# Patient Record
Sex: Male | Born: 1935 | Race: White | Hispanic: No | Smoking: Never smoker
Health system: Southern US, Community
[De-identification: ages and names within clinical notes are randomized; demographics above are authoritative.]

## PROBLEM LIST (undated history)

## (undated) DIAGNOSIS — N189 Chronic kidney disease, unspecified: Secondary | ICD-10-CM

## (undated) DIAGNOSIS — F0390 Unspecified dementia without behavioral disturbance: Secondary | ICD-10-CM

## (undated) HISTORY — PX: TOTAL HIP ARTHROPLASTY: SHX124

## (undated) HISTORY — PX: CHOLECYSTECTOMY: SHX55

---

## 2014-07-09 DIAGNOSIS — N400 Enlarged prostate without lower urinary tract symptoms: Secondary | ICD-10-CM | POA: Insufficient documentation

## 2015-11-23 DIAGNOSIS — D631 Anemia in chronic kidney disease: Secondary | ICD-10-CM | POA: Diagnosis present

## 2015-11-23 DIAGNOSIS — N183 Chronic kidney disease, stage 3 unspecified: Secondary | ICD-10-CM | POA: Diagnosis present

## 2018-04-06 DIAGNOSIS — F419 Anxiety disorder, unspecified: Secondary | ICD-10-CM | POA: Insufficient documentation

## 2018-04-06 DIAGNOSIS — G609 Hereditary and idiopathic neuropathy, unspecified: Secondary | ICD-10-CM | POA: Insufficient documentation

## 2021-08-10 ENCOUNTER — Other Ambulatory Visit: Payer: Self-pay

## 2021-08-10 ENCOUNTER — Emergency Department (HOSPITAL_BASED_OUTPATIENT_CLINIC_OR_DEPARTMENT_OTHER)
Admission: EM | Admit: 2021-08-10 | Discharge: 2021-08-10 | Disposition: A | Discharge status: Home / Self Care | Payer: Medicare Other | Attending: Emergency Medicine | Admitting: Emergency Medicine

## 2021-08-10 ENCOUNTER — Emergency Department (HOSPITAL_BASED_OUTPATIENT_CLINIC_OR_DEPARTMENT_OTHER): Payer: Medicare Other

## 2021-08-10 ENCOUNTER — Encounter (HOSPITAL_BASED_OUTPATIENT_CLINIC_OR_DEPARTMENT_OTHER): Payer: Self-pay | Admitting: *Deleted

## 2021-08-10 DIAGNOSIS — Z96642 Presence of left artificial hip joint: Secondary | ICD-10-CM | POA: Insufficient documentation

## 2021-08-10 DIAGNOSIS — F0390 Unspecified dementia without behavioral disturbance: Secondary | ICD-10-CM | POA: Insufficient documentation

## 2021-08-10 DIAGNOSIS — F0391 Unspecified dementia with behavioral disturbance: Secondary | ICD-10-CM

## 2021-08-10 HISTORY — DX: Unspecified dementia, unspecified severity, without behavioral disturbance, psychotic disturbance, mood disturbance, and anxiety: F03.90

## 2021-08-10 LAB — CBC WITH DIFFERENTIAL/PLATELET
Abs Immature Granulocytes: 0.02 10*3/uL (ref 0.00–0.07)
Basophils Absolute: 0.1 10*3/uL (ref 0.0–0.1)
Basophils Relative: 1 %
Eosinophils Absolute: 0.2 10*3/uL (ref 0.0–0.5)
Eosinophils Relative: 3 %
HCT: 34.9 % — ABNORMAL LOW (ref 39.0–52.0)
Hemoglobin: 11.6 g/dL — ABNORMAL LOW (ref 13.0–17.0)
Immature Granulocytes: 0 %
Lymphocytes Relative: 31 %
Lymphs Abs: 2.7 10*3/uL (ref 0.7–4.0)
MCH: 32.6 pg (ref 26.0–34.0)
MCHC: 33.2 g/dL (ref 30.0–36.0)
MCV: 98 fL (ref 80.0–100.0)
Monocytes Absolute: 0.6 10*3/uL (ref 0.1–1.0)
Monocytes Relative: 7 %
Neutro Abs: 5.1 10*3/uL (ref 1.7–7.7)
Neutrophils Relative %: 58 %
Platelets: 197 10*3/uL (ref 150–400)
RBC: 3.56 MIL/uL — ABNORMAL LOW (ref 4.22–5.81)
RDW: 13.1 % (ref 11.5–15.5)
WBC: 8.7 10*3/uL (ref 4.0–10.5)
nRBC: 0 % (ref 0.0–0.2)

## 2021-08-10 LAB — COMPREHENSIVE METABOLIC PANEL
ALT: 12 U/L (ref 0–44)
AST: 21 U/L (ref 15–41)
Albumin: 4 g/dL (ref 3.5–5.0)
Alkaline Phosphatase: 86 U/L (ref 38–126)
Anion gap: 8 (ref 5–15)
BUN: 29 mg/dL — ABNORMAL HIGH (ref 8–23)
CO2: 24 mmol/L (ref 22–32)
Calcium: 8.6 mg/dL — ABNORMAL LOW (ref 8.9–10.3)
Chloride: 107 mmol/L (ref 98–111)
Creatinine, Ser: 1.71 mg/dL — ABNORMAL HIGH (ref 0.61–1.24)
GFR, Estimated: 39 mL/min — ABNORMAL LOW (ref >60)
Glucose, Bld: 85 mg/dL (ref 70–99)
Potassium: 4.4 mmol/L (ref 3.5–5.1)
Sodium: 139 mmol/L (ref 135–145)
Total Bilirubin: 0.4 mg/dL (ref 0.3–1.2)
Total Protein: 7.4 g/dL (ref 6.5–8.1)

## 2021-08-10 LAB — URINALYSIS, ROUTINE W REFLEX MICROSCOPIC
Bilirubin Urine: NEGATIVE
Glucose, UA: NEGATIVE mg/dL
Hgb urine dipstick: NEGATIVE
Ketones, ur: NEGATIVE mg/dL
Leukocytes,Ua: NEGATIVE
Nitrite: NEGATIVE
Protein, ur: NEGATIVE mg/dL
Specific Gravity, Urine: 1.015 (ref 1.005–1.030)
pH: 6 (ref 5.0–8.0)

## 2021-08-10 MED ORDER — ZOLPIDEM TARTRATE 5 MG PO TABS: 5.0000 mg | ORAL_TABLET | Freq: Every evening | ORAL | 0 refills | Status: DC | PRN

## 2021-08-10 MED ORDER — SODIUM CHLORIDE 0.9 % IV BOLUS
1000.0000 mL | Freq: Once | INTRAVENOUS | Status: AC
  Administered 2021-08-10: 1000 mL via INTRAVENOUS

## 2021-08-10 MED ORDER — HYDROXYZINE HCL 10 MG PO TABS: 10.0000 mg | ORAL_TABLET | Freq: Three times a day (TID) | ORAL | 0 refills | Status: DC | PRN

## 2021-08-10 NOTE — Discharge Instructions (Signed)
He likely has worsening dementia.  Please continue his mirtazapine.  If he has trouble sleeping, he can try Ambien at night.  You can try hydroxyzine for anxiety during the day  See your doctor for follow-up.  Please continue to work on placement to a memory care unit  Return to ER if he has worse agitation, aggressive behavior, anxiety

## 2021-08-10 NOTE — ED Provider Notes (Signed)
Presented [facility] EMERGENCY DEPARTMENT Provider Note   CSN: [id number] Arrival date & time: 08/10/21  1623     History Chief Complaint  Patient presents with   Dementia    Jesse Cherry is a 87 y.o. male hx of dementia, with worsening mental status and dementia.  Patient has worsening dementia over the last month or so.  Per the daughter who is at bedside, patient lives at home and gets very anxious.  He calls her constantly at work.  He also wanders around the neighborhood.  Also he does not sleep well at night and just walks around.  Patient was started on mirtazapine with minimal improvement.  Daughter already started the process for memory care unit and just submitted paperwork for [facility].  Denies any recent falls .  Patient unable to give me much meaningful history and history.  Daughter at bedside.  The history is provided by the patient and a caregiver.      Past Medical History:  Diagnosis Date   Dementia (HCC)     There are no problems to display for this patient.   Past Surgical History:  Procedure Laterality Date   CHOLECYSTECTOMY     TOTAL HIP ARTHROPLASTY Left        History reviewed. No pertinent family history.  Social History   Tobacco Use   Smoking status: Never   Smokeless tobacco: Never  Substance Use Topics   Alcohol use: Never   Drug use: Never    Home Medications Prior to Admission medications   Not on File    Allergies    Demerol [meperidine hcl]  Review of Systems   Review of Systems  Psychiatric/Behavioral:  Positive for confusion.   All other systems reviewed and are negative.  Physical Exam Updated Vital Signs BP 100/87   Pulse 65   Temp 98.4 F (36.9 C) (Oral)   Resp 16   Ht 5\' 10"  (1.778 m)   Wt 68 kg   SpO2 99%   BMI 21.52 kg/m   Physical Exam Vitals and nursing note reviewed.  Constitutional:      Comments: Demented  HENT:     Head: Normocephalic.     Nose: Nose normal.     Mouth/Throat:      Mouth: Mucous membranes are moist.  Eyes:     Extraocular Movements: Extraocular movements intact.     Pupils: Pupils are equal, round, and reactive to light.  Cardiovascular:     Rate and Rhythm: Normal rate and regular rhythm.     Pulses: Normal pulses.     Heart sounds: Normal heart sounds.  Pulmonary:     Effort: Pulmonary effort is normal.     Breath sounds: Normal breath sounds.  Abdominal:     General: Abdomen is flat.     Palpations: Abdomen is soft.  Musculoskeletal:        General: Normal range of motion.     Cervical back: Normal range of motion and neck supple.  Skin:    General: Skin is warm.     Capillary Refill: Capillary refill takes less than 2 seconds.  Neurological:     Comments: Demented and ANO x2.  Patient is able to ambulate and has nonfocal neuro exam.  Psychiatric:        Mood and Affect: Mood normal.    ED Results / Procedures / Treatments   Labs (all labs ordered are listed, but only abnormal results are displayed) Labs Reviewed  CBC  WITH DIFFERENTIAL/PLATELET - Abnormal; Notable for the following components:      Result Value   RBC 3.56 (*)    Hemoglobin 11.6 (*)    HCT 34.9 (*)    All other components within normal limits  COMPREHENSIVE METABOLIC PANEL - Abnormal; Notable for the following components:   BUN 29 (*)    Creatinine, Ser 1.71 (*)    Calcium 8.6 (*)    GFR, Estimated 39 (*)    All other components within normal limits  URINALYSIS, ROUTINE W REFLEX MICROSCOPIC    EKG None  Radiology CT HEAD WO CONTRAST ( )  Result Date: 08/10/2021 CLINICAL DATA:  Dementia patient with altered mental status. EXAM: CT HEAD WITHOUT CONTRAST TECHNIQUE: Contiguous axial images were obtained from the base of the skull through the vertex without intravenous contrast. COMPARISON:  Brain MRI 01/09/2016 FINDINGS: Brain: No evidence of acute infarction, hemorrhage, hydrocephalus, extra-axial collection or mass lesion/mass effect. Generalized  atrophy and ventriculomegaly, with slight progression from 2017 exam. Remote right basal gangliar lacunar infarct. Vascular: Atherosclerosis of skullbase vasculature without hyperdense vessel or abnormal calcification. Skull: No fracture or focal lesion. Sinuses/Orbits: Paranasal sinuses and mastoid air cells are clear. The visualized orbits are unremarkable. Bilateral cataract resection. Other: None. IMPRESSION: 1. No acute intracranial abnormality. 2. Generalized atrophy and ventriculomegaly, with slight progression from 2017 exam. Electronically Signed   By: Narda Rutherford M.D.   On: 08/10/2021 19:58   DG Chest Port 1 View  Result Date: 08/10/2021 CLINICAL DATA:  Weakness. EXAM: PORTABLE CHEST 1 VIEW COMPARISON:  10/31/2018 FINDINGS: Stable heart size.The cardiomediastinal contours are unchanged with aortic tortuosity. Streaky right basilar atelectasis or scarring. Pulmonary vasculature is normal. No consolidation, pleural effusion, or pneumothorax. No acute osseous abnormalities are seen. IMPRESSION: Streaky right basilar atelectasis or scarring. Electronically Signed   By: Narda Rutherford M.D.   On: 08/10/2021 20:04    Procedures Procedures   Medications Ordered in ED Medications  sodium chloride 0.9 % bolus 1,000 mL (0 mLs Intravenous Stopped 08/10/21 2034)    ED Course  I have reviewed the triage vital signs and the nursing notes.  Pertinent labs & imaging results that were available during my care of the patient were reviewed by me and considered in my medical decision making (see chart for details).    MDM Rules/Calculators/A&P                           Jesse Cherry is a 87 y.o. male here presenting with worsening dementia.  Patient also has some behavioral disturbances.  I told daughter that this is likely progression of his dementia.  We will get CT head and labs and urinalysis and chest x-ray to rule out organic causes.  Patient is already in the process of getting into a  memory care unit  8:53 PM CT head unremarkable.  Labs showed creatinine of 1.7 and baseline is 1.6.  Urinalysis unremarkable and chest x-ray unremarkable.  Daughter is just very concerned about sleep and anxiety during the day.  I told her that we can try Ambien as needed at night and hydroxyzine during the day.  I told her to continue to work on placement.  I offered home health but she states that he does not like any strangers in the home and has changed the way home health aides previously.  Final Clinical Impression(s) / ED Diagnoses Final diagnoses:  None    Rx / DC Orders ED  Discharge Orders     None        Charlynne Pander, MD 08/10/21 2055

## 2021-08-10 NOTE — ED Triage Notes (Signed)
Pts daughter reports pt has a hx of dementia, states that in the last month, the forgetfulness has been worse.  Denies injury.  Requesting to have pt placed in nursing home.  Pt ambulatory. A/O per his norm.

## 2021-12-23 ENCOUNTER — Encounter (HOSPITAL_BASED_OUTPATIENT_CLINIC_OR_DEPARTMENT_OTHER): Payer: Self-pay | Admitting: *Deleted

## 2021-12-23 ENCOUNTER — Emergency Department (HOSPITAL_BASED_OUTPATIENT_CLINIC_OR_DEPARTMENT_OTHER)
Admission: EM | Admit: 2021-12-23 | Discharge: 2021-12-23 | Disposition: A | Discharge status: Home / Self Care | Payer: Medicare Other | Attending: Emergency Medicine | Admitting: Emergency Medicine

## 2021-12-23 ENCOUNTER — Emergency Department (HOSPITAL_BASED_OUTPATIENT_CLINIC_OR_DEPARTMENT_OTHER): Payer: Medicare Other

## 2021-12-23 ENCOUNTER — Other Ambulatory Visit: Payer: Self-pay

## 2021-12-23 DIAGNOSIS — U071 COVID-19: Secondary | ICD-10-CM | POA: Diagnosis not present

## 2021-12-23 DIAGNOSIS — R4182 Altered mental status, unspecified: Secondary | ICD-10-CM

## 2021-12-23 DIAGNOSIS — F03C3 Unspecified dementia, severe, with mood disturbance: Secondary | ICD-10-CM | POA: Diagnosis not present

## 2021-12-23 DIAGNOSIS — E860 Dehydration: Secondary | ICD-10-CM | POA: Insufficient documentation

## 2021-12-23 LAB — URINALYSIS, MICROSCOPIC (REFLEX)

## 2021-12-23 LAB — CBC WITH DIFFERENTIAL/PLATELET
Abs Immature Granulocytes: 0.1 10*3/uL — ABNORMAL HIGH (ref 0.00–0.07)
Basophils Absolute: 0 10*3/uL (ref 0.0–0.1)
Basophils Relative: 0 %
Eosinophils Absolute: 0.1 10*3/uL (ref 0.0–0.5)
Eosinophils Relative: 1 %
HCT: 36 % — ABNORMAL LOW (ref 39.0–52.0)
Hemoglobin: 11.8 g/dL — ABNORMAL LOW (ref 13.0–17.0)
Immature Granulocytes: 1 %
Lymphocytes Relative: 18 %
Lymphs Abs: 1.9 10*3/uL (ref 0.7–4.0)
MCH: 30.6 pg (ref 26.0–34.0)
MCHC: 32.8 g/dL (ref 30.0–36.0)
MCV: 93.3 fL (ref 80.0–100.0)
Monocytes Absolute: 0.8 10*3/uL (ref 0.1–1.0)
Monocytes Relative: 8 %
Neutro Abs: 7.5 10*3/uL (ref 1.7–7.7)
Neutrophils Relative %: 72 %
Platelets: 367 10*3/uL (ref 150–400)
RBC: 3.86 MIL/uL — ABNORMAL LOW (ref 4.22–5.81)
RDW: 13 % (ref 11.5–15.5)
WBC: 10.4 10*3/uL (ref 4.0–10.5)
nRBC: 0 % (ref 0.0–0.2)

## 2021-12-23 LAB — COMPREHENSIVE METABOLIC PANEL
ALT: 7 U/L (ref 0–44)
AST: 12 U/L — ABNORMAL LOW (ref 15–41)
Albumin: 3.6 g/dL (ref 3.5–5.0)
Alkaline Phosphatase: 72 U/L (ref 38–126)
Anion gap: 11 (ref 5–15)
BUN: 45 mg/dL — ABNORMAL HIGH (ref 8–23)
CO2: 24 mmol/L (ref 22–32)
Calcium: 9.1 mg/dL (ref 8.9–10.3)
Chloride: 104 mmol/L (ref 98–111)
Creatinine, Ser: 1.75 mg/dL — ABNORMAL HIGH (ref 0.61–1.24)
GFR, Estimated: 37 mL/min — ABNORMAL LOW (ref >60)
Glucose, Bld: 103 mg/dL — ABNORMAL HIGH (ref 70–99)
Potassium: 3.6 mmol/L (ref 3.5–5.1)
Sodium: 139 mmol/L (ref 135–145)
Total Bilirubin: 0.6 mg/dL (ref 0.3–1.2)
Total Protein: 7.6 g/dL (ref 6.5–8.1)

## 2021-12-23 LAB — URINALYSIS, ROUTINE W REFLEX MICROSCOPIC
Bilirubin Urine: NEGATIVE
Glucose, UA: NEGATIVE mg/dL
Hgb urine dipstick: NEGATIVE
Ketones, ur: NEGATIVE mg/dL
Nitrite: NEGATIVE
Protein, ur: 30 mg/dL — AB
Specific Gravity, Urine: 1.025 (ref 1.005–1.030)
pH: 5.5 (ref 5.0–8.0)

## 2021-12-23 LAB — RESP PANEL BY RT-PCR (FLU A&B, COVID) ARPGX2
Influenza A by PCR: NEGATIVE
Influenza B by PCR: NEGATIVE
SARS Coronavirus 2 by RT PCR: POSITIVE — AB

## 2021-12-23 MED ORDER — SODIUM CHLORIDE 0.9 % IV BOLUS
500.0000 mL | Freq: Once | INTRAVENOUS | Status: AC
  Administered 2021-12-23: 500 mL via INTRAVENOUS

## 2021-12-23 MED ORDER — SODIUM CHLORIDE 0.9 % IV SOLN: INTRAVENOUS | Status: DC

## 2021-12-23 NOTE — ED Notes (Signed)
Closing eyes intermittently, all safety matters remain implemented for pt safety. When attempting to ask questions, pt does not respond appropriately. Intermittently with call out "Help Me". Comfort measures provided.

## 2021-12-23 NOTE — Discharge Instructions (Signed)
Work-up without any acute findings.  COVID influenza testing still pending.  Urinalysis not classic for urinary tract infection.  Sent for culture and if it grows any significant bacteria of the facility will be updated.  But no clear evidence of urinary tract infection.  Work-up also otherwise negative other than some mild dehydration.  Patient stable for [facility].

## 2021-12-23 NOTE — ED Provider Notes (Addendum)
[facility] Provider Note   CSN: [id number] Arrival date & time: 12/23/21  1043     History  Chief Complaint  Patient presents with   Altered Mental Status    Jesse Cherry is a 88 y.o. male.  Patient brought in by family members.  Patient was being transferred from [facility].  Family feels that his mental status is worse than usual.  They seems to be more sedated.  They are concerned has not been eating or drinking.  Patient was diagnosed with COVID at the facility on January 24 according to the daughter.  No records in our system about that.  Patient known to have dementia known to be combative at times.  Patient's had his gallbladder removed.  Patient's medications that are listed are hydroxyzine and Ambien.  But suspect he is on other things.  But we do not have records.   Past medical otherwise significant for dementia gallbladder removal patient never smoked.      Home Medications Prior to Admission medications   Medication Sig Start Date End Date Taking? Authorizing Provider  hydrOXYzine (ATARAX/VISTARIL) 10 MG tablet Take 1 tablet (10 mg total) by mouth every 8 (eight) hours as needed. 08/10/21   Drenda Freeze, MD  zolpidem (AMBIEN) 5 MG tablet Take 1 tablet (5 mg total) by mouth at bedtime as needed for sleep. 08/10/21   Drenda Freeze, MD      Allergies    Demerol [meperidine hcl]    Review of Systems   Review of Systems  Unable to perform ROS: Dementia   Physical Exam Updated Vital Signs BP 113/65    Pulse (!) 57    Temp 98.4 F (36.9 C) (Oral)    Resp 17    Ht 1.778 m (5\' 10" )    Wt 68 kg    SpO2 97%    BMI 21.51 kg/m  Physical Exam Vitals and nursing note reviewed.  Constitutional:      General: He is not in acute distress.    Appearance: Normal appearance. He is well-developed. He is ill-appearing. He is not toxic-appearing.  HENT:     Head: Normocephalic and atraumatic.      Mouth/Throat:     Mouth: Mucous membranes are dry.  Eyes:     Extraocular Movements: Extraocular movements intact.     Conjunctiva/sclera: Conjunctivae normal.     Pupils: Pupils are equal, round, and reactive to light.  Cardiovascular:     Rate and Rhythm: Normal rate and regular rhythm.     Heart sounds: No murmur heard. Pulmonary:     Effort: Pulmonary effort is normal. No respiratory distress.     Breath sounds: Normal breath sounds.  Abdominal:     General: There is no distension.     Palpations: Abdomen is soft.     Tenderness: There is no abdominal tenderness. There is no guarding.  Musculoskeletal:        General: No swelling.     Cervical back: Normal range of motion and neck supple.     Right lower leg: No edema.     Left lower leg: No edema.  Skin:    General: Skin is warm and dry.     Capillary Refill: Capillary refill takes less than 2 seconds.  Neurological:     Mental Status: He is alert. Mental status is at baseline.  Psychiatric:        Mood and Affect: Mood  normal.    ED Results / Procedures / Treatments   Labs (all labs ordered are listed, but only abnormal results are displayed) Labs Reviewed  CBC WITH DIFFERENTIAL/PLATELET - Abnormal; Notable for the following components:      Result Value   RBC 3.86 (*)    Hemoglobin 11.8 (*)    HCT 36.0 (*)    Abs Immature Granulocytes 0.10 (*)    All other components within normal limits  COMPREHENSIVE METABOLIC PANEL - Abnormal; Notable for the following components:   Glucose, Bld 103 (*)    BUN 45 (*)    Creatinine, Ser 1.75 (*)    AST 12 (*)    GFR, Estimated 37 (*)    All other components within normal limits  RESP PANEL BY RT-PCR (FLU A&B, COVID) [id number]  URINALYSIS, ROUTINE W REFLEX MICROSCOPIC    EKG EKG Interpretation  Date/Time:  Monday December 23 2021 13:10:05 EST Ventricular Rate:  64 PR Interval:  154 QRS Duration: 98 QT Interval:  432 QTC Calculation: 446 R Axis:   -44 Text  Interpretation: Sinus rhythm Left anterior fascicular block Low voltage, precordial leads No previous ECGs available Confirmed by Fredia Sorrow [phone]) on 12/23/2021 2:28:31 PM  Radiology DG Chest Port 1 View  Result Date: 12/23/2021 CLINICAL DATA:  Altered mental status EXAM: PORTABLE CHEST 1 VIEW COMPARISON:  Chest x-ray dated August 10, 2021 FINDINGS: Cardiac and mediastinal contours are unchanged when accounting for differences in lung volumes. Mild bibasilar atelectasis. No focal consolidation. No large pleural effusion or pneumothorax. IMPRESSION: No active disease. Electronically Signed   By: Yetta Glassman M.D.   On: 12/23/2021 12:50    Procedures Procedures    Medications Ordered in ED Medications  0.9 %  sodium chloride infusion (has no administration in time range)  sodium chloride 0.9 % bolus 500 mL (500 mLs Intravenous New Bag/Given 12/23/21 1334)    ED Course/ Medical Decision Making/ A&P                           Medical Decision Making Amount and/or Complexity of Data Reviewed Labs: ordered. Radiology: ordered.  Risk Prescription drug management.   Patient's family wants a work-up for change in mental status.  Not able to directly compare.  But patient certainly has a history of significant dementia and known to be combative.  She possibly could have been sedated at [place] for the transfer to [facility] today.  Patient will stick his tongue out.  And it is dry.  Still clinically appears dry.  No leg swelling.  Abdomen soft nontender.  No history of any nausea vomiting or diarrhea according to family.  CBC significant for hemoglobin 11.8.  White blood cell count is normal at 10.4.  The complete metabolic panel glucose is 103 CO2 24 potassium good at 3.6 sodium good at 139.  BUN elevated at 45 creatinine 1.37 for a GFR of 37 but patient has been close to that in the past.  So no significant change.  But clinically does appear dehydrated we will give some  IV fluids.  Still a family concern will get CT head chest x-ray we will formalize the COVID testing since we do not have it in our system.  And will get chest x-ray.  Then reassess.  CT head and COVID influenza testing still pending.  CBC no leukocytosis hemoglobin reasonable 11.8.  Patient's complete metabolic panel BUN of 45 a little elevated creatinine  1.75 GFR a little higher than usual as stated above.  But not significantly changed.  Anion gap is normal.  Chest x-ray no active disease.  Patient has been receiving IV fluids.  If CT head has no acute findings.  Patient could be discharged.  COVID testing is not necessary.  Plus had a positive test in January at the nursing facility.  More of the influenza testing of interest.  But can be followed up as an outpatient.  Patient's COVID testing still pending.  Patient's urinalysis is back negative for urinary tract infection some slight increase in white blood cells.  So sent for culture.  Nursing facility can follow-up on the COVID and influenza testing.  Patient stable for discharge.  Patient now more alert as well.  Had long discussion with family.  Again little concerned that maybe the other facility was over sedating him due to his dementia and behavioral health issues.  Final Clinical Impression(s) / ED Diagnoses Final diagnoses:  Severe dementia with mood disturbance, unspecified dementia type  Altered mental status, unspecified altered mental status type  Dehydration    Rx / DC Orders ED Discharge Orders     None         Fredia Sorrow, MD 12/23/21 1430    Fredia Sorrow, MD 12/23/21 1520

## 2021-12-23 NOTE — ED Notes (Signed)
CLIENT IS A HIGH FALL RISK, POTENTIAL FOR BEING COMBATIVE, HIGH FALL RISK BAND APPLIED, SR X 2 UP, PADDED. LEAVE DOOR OPEN FOR CONSTANT OBSERVATION. DAUGHTER AT BEDSIDE

## 2021-12-23 NOTE — ED Notes (Signed)
Spoke with lab to add on urine culture 

## 2021-12-23 NOTE — ED Triage Notes (Signed)
Resides at [facility], Developed Covid in January 23rd, has not been eating or drinking. Family has noted worsening of mental condition.

## 2021-12-25 LAB — URINE CULTURE

## 2022-03-05 ENCOUNTER — Inpatient Hospital Stay (HOSPITAL_COMMUNITY): Admitting: Anesthesiology

## 2022-03-05 ENCOUNTER — Emergency Department (HOSPITAL_COMMUNITY)

## 2022-03-05 ENCOUNTER — Inpatient Hospital Stay (HOSPITAL_COMMUNITY)
Admission: EM | Admit: 2022-03-05 | Discharge: 2022-03-09 | DRG: 522 | Disposition: A | Discharge status: Home Health | Source: Skilled Nursing Facility | Attending: Internal Medicine | Admitting: Internal Medicine

## 2022-03-05 ENCOUNTER — Encounter (HOSPITAL_COMMUNITY): Payer: Self-pay | Admitting: Internal Medicine

## 2022-03-05 ENCOUNTER — Inpatient Hospital Stay (HOSPITAL_COMMUNITY)

## 2022-03-05 DIAGNOSIS — S72031A Displaced midcervical fracture of right femur, initial encounter for closed fracture: Secondary | ICD-10-CM | POA: Diagnosis present

## 2022-03-05 DIAGNOSIS — Y92098 Other place in other non-institutional residence as the place of occurrence of the external cause: Secondary | ICD-10-CM | POA: Diagnosis not present

## 2022-03-05 DIAGNOSIS — M25551 Pain in right hip: Secondary | ICD-10-CM | POA: Diagnosis present

## 2022-03-05 DIAGNOSIS — N179 Acute kidney failure, unspecified: Secondary | ICD-10-CM | POA: Diagnosis present

## 2022-03-05 DIAGNOSIS — Z66 Do not resuscitate: Secondary | ICD-10-CM | POA: Diagnosis present

## 2022-03-05 DIAGNOSIS — F03C0 Unspecified dementia, severe, without behavioral disturbance, psychotic disturbance, mood disturbance, and anxiety: Secondary | ICD-10-CM | POA: Diagnosis present

## 2022-03-05 DIAGNOSIS — D62 Acute posthemorrhagic anemia: Secondary | ICD-10-CM | POA: Diagnosis present

## 2022-03-05 DIAGNOSIS — E860 Dehydration: Secondary | ICD-10-CM | POA: Diagnosis present

## 2022-03-05 DIAGNOSIS — N1832 Chronic kidney disease, stage 3b: Secondary | ICD-10-CM | POA: Diagnosis present

## 2022-03-05 DIAGNOSIS — S72009A Fracture of unspecified part of neck of unspecified femur, initial encounter for closed fracture: Secondary | ICD-10-CM | POA: Diagnosis present

## 2022-03-05 DIAGNOSIS — D539 Nutritional anemia, unspecified: Secondary | ICD-10-CM | POA: Diagnosis present

## 2022-03-05 DIAGNOSIS — E875 Hyperkalemia: Secondary | ICD-10-CM | POA: Diagnosis present

## 2022-03-05 DIAGNOSIS — S72001A Fracture of unspecified part of neck of right femur, initial encounter for closed fracture: Secondary | ICD-10-CM | POA: Diagnosis not present

## 2022-03-05 DIAGNOSIS — W1830XA Fall on same level, unspecified, initial encounter: Secondary | ICD-10-CM | POA: Diagnosis present

## 2022-03-05 DIAGNOSIS — N189 Chronic kidney disease, unspecified: Secondary | ICD-10-CM | POA: Diagnosis not present

## 2022-03-05 DIAGNOSIS — F0390 Unspecified dementia without behavioral disturbance: Secondary | ICD-10-CM | POA: Diagnosis not present

## 2022-03-05 DIAGNOSIS — D631 Anemia in chronic kidney disease: Secondary | ICD-10-CM | POA: Diagnosis not present

## 2022-03-05 DIAGNOSIS — Z96642 Presence of left artificial hip joint: Secondary | ICD-10-CM | POA: Diagnosis present

## 2022-03-05 DIAGNOSIS — S72141A Displaced intertrochanteric fracture of right femur, initial encounter for closed fracture: Secondary | ICD-10-CM | POA: Diagnosis present

## 2022-03-05 HISTORY — DX: Chronic kidney disease, unspecified: N18.9

## 2022-03-05 LAB — BASIC METABOLIC PANEL
Anion gap: 8 (ref 5–15)
Anion gap: 6 (ref 5–15)
BUN: 52 mg/dL — ABNORMAL HIGH (ref 8–23)
BUN: 49 mg/dL — ABNORMAL HIGH (ref 8–23)
CO2: 20 mmol/L — ABNORMAL LOW (ref 22–32)
CO2: 23 mmol/L (ref 22–32)
Calcium: 8.3 mg/dL — ABNORMAL LOW (ref 8.9–10.3)
Calcium: 8 mg/dL — ABNORMAL LOW (ref 8.9–10.3)
Chloride: 111 mmol/L (ref 98–111)
Chloride: 111 mmol/L (ref 98–111)
Creatinine, Ser: 2.4 mg/dL — ABNORMAL HIGH (ref 0.61–1.24)
Creatinine, Ser: 2.17 mg/dL — ABNORMAL HIGH (ref 0.61–1.24)
GFR, Estimated: 26 mL/min — ABNORMAL LOW (ref >60)
GFR, Estimated: 29 mL/min — ABNORMAL LOW (ref >60)
Glucose, Bld: 107 mg/dL — ABNORMAL HIGH (ref 70–99)
Glucose, Bld: 81 mg/dL (ref 70–99)
Potassium: 5.2 mmol/L — ABNORMAL HIGH (ref 3.5–5.1)
Potassium: 6 mmol/L — ABNORMAL HIGH (ref 3.5–5.1)
Sodium: 139 mmol/L (ref 135–145)
Sodium: 140 mmol/L (ref 135–145)

## 2022-03-05 LAB — CBC WITH DIFFERENTIAL/PLATELET
Abs Immature Granulocytes: 0.09 10*3/uL — ABNORMAL HIGH (ref 0.00–0.07)
Basophils Absolute: 0.1 10*3/uL (ref 0.0–0.1)
Basophils Relative: 1 %
Eosinophils Absolute: 0 10*3/uL (ref 0.0–0.5)
Eosinophils Relative: 0 %
HCT: 29.7 % — ABNORMAL LOW (ref 39.0–52.0)
Hemoglobin: 8.9 g/dL — ABNORMAL LOW (ref 13.0–17.0)
Immature Granulocytes: 1 %
Lymphocytes Relative: 10 %
Lymphs Abs: 1.4 10*3/uL (ref 0.7–4.0)
MCH: 31.6 pg (ref 26.0–34.0)
MCHC: 30 g/dL (ref 30.0–36.0)
MCV: 105.3 fL — ABNORMAL HIGH (ref 80.0–100.0)
Monocytes Absolute: 1 10*3/uL (ref 0.1–1.0)
Monocytes Relative: 8 %
Neutro Abs: 10.6 10*3/uL — ABNORMAL HIGH (ref 1.7–7.7)
Neutrophils Relative %: 80 %
Platelets: 220 10*3/uL (ref 150–400)
RBC: 2.82 MIL/uL — ABNORMAL LOW (ref 4.22–5.81)
RDW: 15.1 % (ref 11.5–15.5)
WBC: 13.2 10*3/uL — ABNORMAL HIGH (ref 4.0–10.5)
nRBC: 0 % (ref 0.0–0.2)

## 2022-03-05 LAB — CBC
HCT: 28.7 % — ABNORMAL LOW (ref 39.0–52.0)
HCT: 26.6 % — ABNORMAL LOW (ref 39.0–52.0)
Hemoglobin: 9 g/dL — ABNORMAL LOW (ref 13.0–17.0)
Hemoglobin: 8.5 g/dL — ABNORMAL LOW (ref 13.0–17.0)
MCH: 32.1 pg (ref 26.0–34.0)
MCH: 32.3 pg (ref 26.0–34.0)
MCHC: 31.4 g/dL (ref 30.0–36.0)
MCHC: 32 g/dL (ref 30.0–36.0)
MCV: 102.5 fL — ABNORMAL HIGH (ref 80.0–100.0)
MCV: 101.1 fL — ABNORMAL HIGH (ref 80.0–100.0)
Platelets: 229 10*3/uL (ref 150–400)
Platelets: 251 10*3/uL (ref 150–400)
RBC: 2.8 MIL/uL — ABNORMAL LOW (ref 4.22–5.81)
RBC: 2.63 MIL/uL — ABNORMAL LOW (ref 4.22–5.81)
RDW: 15 % (ref 11.5–15.5)
RDW: 15.4 % (ref 11.5–15.5)
WBC: 11.2 10*3/uL — ABNORMAL HIGH (ref 4.0–10.5)
WBC: 9.7 10*3/uL (ref 4.0–10.5)
nRBC: 0 % (ref 0.0–0.2)
nRBC: 0 % (ref 0.0–0.2)

## 2022-03-05 LAB — PROTIME-INR
INR: 1.2 (ref 0.8–1.2)
Prothrombin Time: 15.4 seconds — ABNORMAL HIGH (ref 11.4–15.2)

## 2022-03-05 LAB — VITAMIN D 25 HYDROXY (VIT D DEFICIENCY, FRACTURES): Vit D, 25-Hydroxy: 44.77 ng/mL (ref 30–100)

## 2022-03-05 LAB — ABO/RH: ABO/RH(D): O POS

## 2022-03-05 LAB — MRSA NEXT GEN BY PCR, NASAL: MRSA by PCR Next Gen: NOT DETECTED

## 2022-03-05 LAB — CREATININE, SERUM
Creatinine, Ser: 2.32 mg/dL — ABNORMAL HIGH (ref 0.61–1.24)
GFR, Estimated: 27 mL/min — ABNORMAL LOW (ref >60)

## 2022-03-05 MED ORDER — ENSURE ENLIVE PO LIQD
237.0000 mL | Freq: Two times a day (BID) | ORAL | Status: DC
  Administered 2022-03-07 – 2022-03-09 (×3): 237 mL via ORAL

## 2022-03-05 MED ORDER — SODIUM ZIRCONIUM CYCLOSILICATE 5 G PO PACK
5.0000 g | PACK | Freq: Once | ORAL | Status: AC
  Administered 2022-03-05: 5 g via ORAL
  Filled 2022-03-05: qty 1

## 2022-03-05 MED ORDER — ONDANSETRON HCL 4 MG/2ML IJ SOLN
4.0000 mg | Freq: Once | INTRAMUSCULAR | Status: AC
  Administered 2022-03-05: 4 mg via INTRAVENOUS
  Filled 2022-03-05: qty 2

## 2022-03-05 MED ORDER — ROPIVACAINE HCL 5 MG/ML IJ SOLN
INTRAMUSCULAR | Status: DC | PRN
  Administered 2022-03-05: 20 mL via PERINEURAL

## 2022-03-05 MED ORDER — MORPHINE SULFATE (PF) 2 MG/ML IV SOLN
0.5000 mg | INTRAVENOUS | Status: DC | PRN
  Administered 2022-03-05 – 2022-03-06 (×4): 0.5 mg via INTRAVENOUS
  Filled 2022-03-05 (×4): qty 1

## 2022-03-05 MED ORDER — MORPHINE SULFATE (PF) 4 MG/ML IV SOLN
4.0000 mg | INTRAVENOUS | Status: DC | PRN
  Administered 2022-03-05: 4 mg via INTRAVENOUS
  Filled 2022-03-05: qty 1

## 2022-03-05 MED ORDER — FENTANYL CITRATE (PF) 100 MCG/2ML IJ SOLN
INTRAMUSCULAR | Status: AC
  Administered 2022-03-05: 50 ug
  Filled 2022-03-05: qty 2

## 2022-03-05 MED ORDER — MELATONIN 3 MG PO TABS
3.0000 mg | ORAL_TABLET | Freq: Every evening | ORAL | Status: DC | PRN
  Administered 2022-03-05 – 2022-03-09 (×3): 3 mg via ORAL
  Filled 2022-03-05 (×3): qty 1

## 2022-03-05 MED ORDER — LACTATED RINGERS IV SOLN: INTRAVENOUS | Status: DC

## 2022-03-05 MED ORDER — ENOXAPARIN SODIUM 30 MG/0.3ML IJ SOSY
30.0000 mg | PREFILLED_SYRINGE | INTRAMUSCULAR | Status: DC
  Administered 2022-03-05 – 2022-03-09 (×4): 30 mg via SUBCUTANEOUS
  Filled 2022-03-05 (×4): qty 0.3

## 2022-03-05 MED ORDER — HYDROCODONE-ACETAMINOPHEN 5-325 MG PO TABS: 1.0000 | ORAL_TABLET | Freq: Four times a day (QID) | ORAL | Status: DC | PRN

## 2022-03-05 NOTE — Anesthesia Pain Management Evaluation Note (Signed)
?  Anesthesia Pain Consult Note ? ?Patient: Jesse Cherry, 88 y.o., male ? ?Consult Requested by: Mendel Corning, MD ? ?Reason for Consult: Right intertrochanteric fracture ? ?Level of Consciousness: lethargic ? ?Pain: severe  ? ?Last Vitals:  ?Vitals:  ? 03/05/22 1111 03/05/22 1115  ?BP: 119/68 116/66  ?Pulse: 62 (!) 58  ?Resp:    ?Temp:    ?SpO2: 96% 96%  ? ? ?Plan: Peripheral nerve block for pain control ? ?Risks of wet tap, epidural hematoma and spinal cord injury explained to:  ? ?Consent:Risks of procedure as well as the alternatives and risks of each were explained to the (patient/caregiver).  Consent for procedure obtained. ? ?Advance Directive:Patient named surrogate decision maker / provided an advance care plan. ? ?Allergies  ?Allergen Reactions  ?? Demerol [Meperidine Hcl]   ? ? ?Physical exam: ?PULM normal  ?CARDIO Heart sounds are normal.  Regular rate and rhythm without murmur, gallop or rub.  ?OTHER   ? ?I have reviewed the patient's medications listed below. ?? enoxaparin (LOVENOX) injection  30 mg Subcutaneous Q24H  ? ?? lactated ringers 100 mL/hr at 03/05/22 0746  ? ?HYDROcodone-acetaminophen, morphine injection ? ?Past Medical History:  ?Diagnosis Date  ?? Dementia ([place])   ? ?Past Surgical History:  ?Procedure Laterality Date  ?? CHOLECYSTECTOMY    ?? TOTAL HIP ARTHROPLASTY Left   ? ? reports that he has never smoked. He has never used smokeless tobacco. He reports that he does not drink alcohol and does not use drugs.  ? ?Plan for Right PENG block in PACU. Consent obtained by phone with patient's daughter. ? ?Santa Lighter ?03/05/2022 ? ? ? ? ?

## 2022-03-05 NOTE — Plan of Care (Signed)

## 2022-03-05 NOTE — H&P (View-Only) (Signed)
Reason for Consult:Right hip fx ?Referring Physician: Thad Cherry ?Time called: 0810 ?Time at bedside: 1340 ? ? ?Jesse Cherry is an 88 y.o. male.  ?HPI: Jesse Cherry suffered an unwitnessed fall at the memory care unit where he resides. He was able to be gotten up but c/o hip pain and was brought to the ED. X-rays showed a right hip fx and orthopedic surgery was consulted. He is moderately demented and cannot contribute to history. ? ?Past Medical History:  ?Diagnosis Date  ? Dementia (HCC)   ? ? ?Past Surgical History:  ?Procedure Laterality Date  ? CHOLECYSTECTOMY    ? TOTAL HIP ARTHROPLASTY Left   ? ? ?No family history on file. ? ?Social History:  reports that he has never smoked. He has never used smokeless tobacco. He reports that he does not drink alcohol and does not use drugs. ? ?Allergies:  ?Allergies  ?Allergen Reactions  ? Demerol [Meperidine Hcl]   ? ? ?Medications: I have reviewed the patient's current medications. ? ?Results for orders placed or performed during the hospital encounter of 03/05/22 (from the past 48 hour(s))  ?Basic metabolic panel     Status: Abnormal  ? Collection Time: 03/05/22  1:24 AM  ?Result Value Ref Range  ? Sodium 139 135 - 145 mmol/L  ? Potassium 5.2 (H) 3.5 - 5.1 mmol/L  ? Chloride 111 98 - 111 mmol/L  ? CO2 20 (L) 22 - 32 mmol/L  ? Glucose, Bld 107 (H) 70 - 99 mg/dL  ?  Comment: Glucose reference range applies only to samples taken after fasting for at least 8 hours.  ? BUN 52 (H) 8 - 23 mg/dL  ? Creatinine, Ser 2.40 (H) 0.61 - 1.24 mg/dL  ? Calcium 8.3 (L) 8.9 - 10.3 mg/dL  ? GFR, Estimated 26 (L) >60 mL/min  ?  Comment: (NOTE) ?Calculated using the CKD-EPI Creatinine Equation (2021) ?  ? Anion gap 8 5 - 15  ?  Comment: Performed at [facility], [address], [place], [place] [address]  ?CBC with Differential     Status: Abnormal  ? Collection Time: 03/05/22  1:24 AM  ?Result Value Ref Range  ? WBC 13.2 (H) 4.0 - 10.5 K/uL  ? RBC 2.82 (L) 4.22 - 5.81 MIL/uL  ?  Hemoglobin 8.9 (L) 13.0 - 17.0 g/dL  ? HCT 29.7 (L) 39.0 - 52.0 %  ? MCV 105.3 (H) 80.0 - 100.0 fL  ? MCH 31.6 26.0 - 34.0 pg  ? MCHC 30.0 30.0 - 36.0 g/dL  ? RDW 15.1 11.5 - 15.5 %  ? Platelets 220 150 - 400 K/uL  ? nRBC 0.0 0.0 - 0.2 %  ? Neutrophils Relative % 80 %  ? Neutro Abs 10.6 (H) 1.7 - 7.7 K/uL  ? Lymphocytes Relative 10 %  ? Lymphs Abs 1.4 0.7 - 4.0 K/uL  ? Monocytes Relative 8 %  ? Monocytes Absolute 1.0 0.1 - 1.0 K/uL  ? Eosinophils Relative 0 %  ? Eosinophils Absolute 0.0 0.0 - 0.5 K/uL  ? Basophils Relative 1 %  ? Basophils Absolute 0.1 0.0 - 0.1 K/uL  ? Immature Granulocytes 1 %  ? Abs Immature Granulocytes 0.09 (H) 0.00 - 0.07 K/uL  ?  Comment: Performed at [facility], [address] [place], [place] [address]  ?Protime-INR     Status: Abnormal  ? Collection Time: 03/05/22  1:24 AM  ?Result Value Ref Range  ? Prothrombin Time 15.4 (H) 11.4 - 15.2  seconds  ? INR 1.2 0.8 - 1.2  ?  Comment: (NOTE) ?INR goal varies based on device and disease states. ?Performed at [facility], [address], [place], [place] ?[address] ?  ?Type and screen [facility]     Status: None  ? Collection Time: 03/05/22  1:51 AM  ?Result Value Ref Range  ? ABO/RH(D) O POS   ? Antibody Screen NEG   ? Sample Expiration    ?  03/08/2022,2359 ?Performed at [facility], [address], [place], [place] [address] ?  ?CBC     Status: Abnormal  ? Collection Time: 03/05/22  8:00 AM  ?Result Value Ref Range  ? WBC 11.2 (H) 4.0 - 10.5 K/uL  ? RBC 2.80 (L) 4.22 - 5.81 MIL/uL  ? Hemoglobin 9.0 (L) 13.0 - 17.0 g/dL  ? HCT 28.7 (L) 39.0 - 52.0 %  ? MCV 102.5 (H) 80.0 - 100.0 fL  ? MCH 32.1 26.0 - 34.0 pg  ? MCHC 31.4 30.0 - 36.0 g/dL  ? RDW 15.0 11.5 - 15.5 %  ? Platelets 229 150 - 400 K/uL  ? nRBC 0.0 0.0 - 0.2 %  ?  Comment: Performed at [facility], [address] [place], [place] [address]  ?Creatinine, serum     Status: Abnormal  ? Collection Time: 03/05/22  8:00 AM  ?Result Value Ref  Range  ? Creatinine, Ser 2.32 (H) 0.61 - 1.24 mg/dL  ? GFR, Estimated 27 (L) >60 mL/min  ?  Comment: (NOTE) ?Calculated using the CKD-EPI Creatinine Equation (2021) ?Performed at [facility], [address], [place], [place] ?[address] ?  ?VITAMIN D 25 Hydroxy (Vit-D Deficiency, Fractures)     Status: None  ? Collection Time: 03/05/22  8:00 AM  ?Result Value Ref Range  ? Vit D, 25-Hydroxy 44.77 30 - 100 ng/mL  ?  Comment: (NOTE) ?Vitamin D deficiency has been defined by the Institute of Medicine  ?and an Endocrine Society practice guideline as a level of serum 25-OH  ?vitamin D less than 20 ng/mL (1,2). The Endocrine Society went on to  ?further define vitamin D insufficiency as a level between 21 and 29  ?ng/mL (2). ? ?1. IOM [employer] of Medicine). 2010. Dietary reference intakes for  ?calcium and D. [place]: [employer]. ?2. Holick MF, [place], Bischoff-Ferrari HA, et al. Evaluation,  ?treatment, and prevention of vitamin D deficiency: an Endocrine  ?Society clinical practice guideline, JCEM. 2011 Jul; 96(7): 1911-30. ? ?Performed at [facility], [address] [place], [place] ?[address] ?  ? ? ?DG Chest 1 View ? ?Result Date: 03/05/2022 ?CLINICAL DATA:  Fall. EXAM: CHEST  1 VIEW COMPARISON:  Chest x-ray 12/23/2021. FINDINGS: The aorta is ectatic, unchanged. Heart is mildly enlarged. There is no focal lung infiltrate, pleural effusion or pneumothorax. Surgical clips are seen in the upper abdomen. No acute fractures are identified. IMPRESSION: 1. No acute cardiopulmonary process. 2. Stable cardiomegaly. Electronically Signed   By: Darliss Cheney M.D.   On: 03/05/2022 01:57  ? ?CT HEAD WO CONTRAST ? ?Result Date: 03/05/2022 ?CLINICAL DATA:  Head trauma. EXAM: CT HEAD WITHOUT CONTRAST TECHNIQUE: Contiguous axial images were obtained from the base of the skull through the vertex without intravenous contrast. RADIATION DOSE REDUCTION: This exam was performed according to the  departmental dose-optimization program which includes automated exposure control, adjustment of the mA and/or kV according to patient size and/or  use of iterative reconstruction technique. COMPARISON:  Head CT dated 12/23/2021. FINDINGS: Brain: Mild age-related atrophy and chronic microvascular ischemic changes. There is no acute intracranial hemorrhage. No mass effect or midline shift. No extra-axial fluid collection. Vascular: No hyperdense vessel or unexpected calcification. Skull: Normal. Negative for fracture or focal lesion. Sinuses/Orbits: Mild diffuse mucoperiosteal thickening of paranasal sinuses. No air-fluid level. The mastoid air cells are clear. Other: None IMPRESSION: 1. No acute intracranial pathology. 2. Mild age-related atrophy and chronic microvascular ischemic changes. Electronically Signed   By: Arash  Radparvar M.D.   On: 03/05/2022 03:28  ? ?CT HIP RIGHT WO CONTRAST ? ?Result Date: 03/05/2022 ?CLINICAL DATA:  Fracture hip, unwitnessed fall. EXAM: CT OF THE RIGHT HIP WITHOUT CONTRAST TECHNIQUE: Multidetector CT imaging of the right hip was performed according to the standard protocol. Multiplanar CT image reconstructions were also generated. RADIATION DOSE REDUCTION: This exam was performed according to the departmental dose-optimization program which includes automated exposure control, adjustment of the mA and/or kV according to patient size and/or use of iterative reconstruction technique. COMPARISON:  Radiographs dated January 05, 2022. FINDINGS: Bones/Joint/Cartilage There is comminuted mildly displaced intertrochanteric fracture of the right femur with superolateral displacement of the distal femur. No pelvic fracture. Sacroiliac joint and pubic symphysis are maintained. The femoral head is located within the acetabular cavity. Ligaments Suboptimally assessed by CT. Muscles and Tendons Intramuscular edema of the gluteus minimus/medius. The remaining muscles and tendons are within normal  limits. Soft tissues Enlarged prostate. IMPRESSION: 1. Mildly comminuted intertrochanteric fracture of the right femur with superolateral displacement of the distal femur. 2. Muscle edema of the gluteus medius/min

## 2022-03-05 NOTE — Anesthesia Procedure Notes (Signed)
Anesthesia Regional Block: Peng block  ? ?Pre-Anesthetic Checklist: , timeout performed,  Correct Patient, Correct Site, Correct Laterality,  Correct Procedure, Correct Position, site marked,  Risks and benefits discussed,  Surgical consent,  Pre-op evaluation,  At surgeon's request and post-op pain management ? ?Laterality: Right ? ?Prep: chloraprep     ?  ?Needles:  ?Injection technique: Single-shot ? ?Needle Type: Echogenic Needle   ? ? ?Needle Length: 9cm  ?Needle Gauge: 21  ? ? ? ?Additional Needles: ? ? ?Procedures:,,,, ultrasound used (permanent image in chart),,    ?Narrative:  ?Start time: 03/05/2022 11:00 AM ?End time: 03/05/2022 11:10 AM ?Injection made incrementally with aspirations every 5 mL. ? ?Performed by: Personally  ?Anesthesiologist: Collene Schlichter, MD ? ?Additional Notes: ?No pain on injection. No increased resistance to injection. Injection made in 5cc increments.  Good needle visualization.  Patient tolerated procedure well. ? ? ? ? ?

## 2022-03-05 NOTE — ED Provider Notes (Signed)
?[facility] EMERGENCY DEPARTMENT ?Provider Note ? ? ?CSN: [id number] ?Arrival date & time: 03/05/22  9485 ? ?  ? ?History ? ?Chief Complaint  ?Patient presents with  ? Fall  ? ? ?Jesse Cherry is a 88 y.o. male. ? ?The history is provided by the nursing home and a relative. The history is limited by the condition of the patient (Dementia).  ?Fall ?He has history of dementia and lives in a memory care unit and was transferred here after having had an unwitnessed fall.  He was noted to have a deformity of his right hip, and was complaining of pain.  EMS did apply a hare traction splint.  He does have history of having had a left hip fracture.  Of note, he is not on any anticoagulants or antiplatelet agents. ?  ?Home Medications ?Prior to Admission medications   ?Medication Sig Start Date End Date Taking? Authorizing Provider  ?hydrOXYzine (ATARAX/VISTARIL) 10 MG tablet Take 1 tablet (10 mg total) by mouth every 8 (eight) hours as needed. 08/10/21   Charlynne Pander, MD  ?zolpidem (AMBIEN) 5 MG tablet Take 1 tablet (5 mg total) by mouth at bedtime as needed for sleep. 08/10/21   Charlynne Pander, MD  ?   ? ?Allergies    ?Demerol [meperidine hcl]   ? ?Review of Systems   ?Review of Systems  ?Unable to perform ROS: Dementia  ? ?Physical Exam ?Updated Vital Signs ?BP 131/79 (BP Location: Right Arm)   Pulse 66   Temp 98 ?F (36.7 ?C) (Oral)   Resp (!) 24   SpO2 97%  ?Physical Exam ?Vitals and nursing note reviewed.  ?88 year old male, resting comfortably and in no acute distress. Vital signs are significant for elevated respiratory rate. Oxygen saturation is 97%, which is normal. ?Head is normocephalic and atraumatic. PERRLA, EOMI.  ?Neck is nontender and supple without adenopathy or JVD. ?Back is nontender and there is no CVA tenderness. ?Lungs are clear without rales, wheezes, or rhonchi. ?Chest is nontender. ?Heart has regular rate and rhythm without murmur. ?Abdomen is soft, flat,  nontender. ?Pelvis is stable and nontender. ?Extremities: Right leg is shortened and externally rotated consistent with hip fracture.  Distal pulses are strong and capillary refill is prompt.  No other deformities seen, there is no cyanosis or edema. ?Skin is warm and dry without rash. ?Neurologic: Awake but nonverbal, cranial nerves are intact, there are no gross motor deficits. ? ?ED Results / Procedures / Treatments   ?Labs ?(all labs ordered are listed, but only abnormal results are displayed) ?Labs Reviewed  ?BASIC METABOLIC PANEL - Abnormal; Notable for the following components:  ?    Result Value  ? Potassium 5.2 (*)   ? CO2 20 (*)   ? Glucose, Bld 107 (*)   ? BUN 52 (*)   ? Creatinine, Ser 2.40 (*)   ? Calcium 8.3 (*)   ? GFR, Estimated 26 (*)   ? All other components within normal limits  ?CBC WITH DIFFERENTIAL/PLATELET - Abnormal; Notable for the following components:  ? WBC 13.2 (*)   ? RBC 2.82 (*)   ? Hemoglobin 8.9 (*)   ? HCT 29.7 (*)   ? MCV 105.3 (*)   ? Neutro Abs 10.6 (*)   ? Abs Immature Granulocytes 0.09 (*)   ? All other components within normal limits  ?PROTIME-INR - Abnormal; Notable for the following components:  ? Prothrombin Time 15.4 (*)   ? All other components  within normal limits  ?TYPE AND SCREEN  ? ? ?EKG ?None ? ?Radiology ?DG Chest 1 View ? ?Result Date: 03/05/2022 ?CLINICAL DATA:  Fall. EXAM: CHEST  1 VIEW COMPARISON:  Chest x-ray 12/23/2021. FINDINGS: The aorta is ectatic, unchanged. Heart is mildly enlarged. There is no focal lung infiltrate, pleural effusion or pneumothorax. Surgical clips are seen in the upper abdomen. No acute fractures are identified. IMPRESSION: 1. No acute cardiopulmonary process. 2. Stable cardiomegaly. Electronically Signed   By: Darliss CheneyAmy  Guttmann M.D.   On: 03/05/2022 01:57  ? ?CT HEAD WO CONTRAST ? ?Result Date: 03/05/2022 ?CLINICAL DATA:  Head trauma. EXAM: CT HEAD WITHOUT CONTRAST TECHNIQUE: Contiguous axial images were obtained from the base of the skull  through the vertex without intravenous contrast. RADIATION DOSE REDUCTION: This exam was performed according to the departmental dose-optimization program which includes automated exposure control, adjustment of the mA and/or kV according to patient size and/or use of iterative reconstruction technique. COMPARISON:  Head CT dated 12/23/2021. FINDINGS: Brain: Mild age-related atrophy and chronic microvascular ischemic changes. There is no acute intracranial hemorrhage. No mass effect or midline shift. No extra-axial fluid collection. Vascular: No hyperdense vessel or unexpected calcification. Skull: Normal. Negative for fracture or focal lesion. Sinuses/Orbits: Mild diffuse mucoperiosteal thickening of paranasal sinuses. No air-fluid level. The mastoid air cells are clear. Other: None IMPRESSION: 1. No acute intracranial pathology. 2. Mild age-related atrophy and chronic microvascular ischemic changes. Electronically Signed   By: Elgie CollardArash  Radparvar M.D.   On: 03/05/2022 03:28  ? ?DG Hip Unilat With Pelvis 2-3 Views Right ? ?Result Date: 03/05/2022 ?CLINICAL DATA:  Fall. EXAM: DG HIP (WITH OR WITHOUT PELVIS) 2-3V RIGHT COMPARISON:  None. FINDINGS: There is an acute comminuted right femoral intratrochanteric fracture with mild superolateral displacement of the distal fracture fragment. There is no dislocation. Vascular calcifications are noted in the pelvis. Left hip screws in place and appears uncomplicated. IMPRESSION: 1. Acute right femoral intratrochanteric fracture. Electronically Signed   By: Darliss CheneyAmy  Guttmann M.D.   On: 03/05/2022 02:01   ? ?Procedures ?Procedures  ? ? ?Medications Ordered in ED ?Medications  ?morphine (PF) 4 MG/ML injection 4 mg (has no administration in time range)  ?ondansetron (ZOFRAN) injection 4 mg (has no administration in time range)  ? ? ?ED Course/ Medical Decision Making/ A&P ?  ?                        ?Medical Decision Making ?Amount and/or Complexity of Data Reviewed ?Labs:  ordered. ?Radiology: ordered. ? ?Risk ?Prescription drug management. ?Decision regarding hospitalization. ? ? ?Fall with strong suspicion of hip fracture.  X-rays ordered.  Because it was an unwitnessed fall, will also check CT of head.  Family was here confirmed that he should be DNR, and DNR status is ordered.  Old records are reviewed confirming left hip fracture in 2019 which was treated operatively. ? ?Hip x-ray shows intertrochanteric fracture of the right hip.  CT of head shows no acute injury.  Chest x-ray shows no acute cardiopulmonary process.  I have independently viewed all images, and agree with the radiologist's interpretation.  Labs do show evidence of acute kidney injury with creatinine 2.40 compared with 1.75 on 12/23/2021.  This is possibly secondary to to dehydration.  Hemoglobin has dropped significantly from 12/23/2021.  Today hemoglobin is 8.9 compared with 11.8.  This may be from blood loss from his hip fracture, will need to be followed.  Case is discussed with  Dr. Eulah Pont of orthopedics, who agrees to see the patient in consultation for surgical management of hip fracture, and is planning on operative repair in the morning and request patient be kept n.p.o.  Case is discussed with Dr. Loney Loh of [facility], who agrees to admit the patient. ? ?Final Clinical Impression(s) / ED Diagnoses ?Final diagnoses:  ?Closed intertrochanteric fracture of right hip, initial encounter (HCC)  ?Acute kidney injury (nontraumatic) (HCC)  ?Macrocytic anemia  ? ? ?Rx / DC Orders ?ED Discharge Orders   ? ? None  ? ?  ? ? ?  ?Dione Booze, MD ?03/05/22 [phone] ? ?

## 2022-03-05 NOTE — Consult Note (Signed)
Reason for Consult:Right hip fx ?Referring Physician: Thad Ranger ?Time called: 0810 ?Time at bedside: 1340 ? ? ?Jesse Cherry is an 88 y.o. male.  ?HPI: Jesse Cherry suffered an unwitnessed fall at the memory care unit where he resides. He was able to be gotten up but c/o hip pain and was brought to the ED. X-rays showed a right hip fx and orthopedic surgery was consulted. He is moderately demented and cannot contribute to history. ? ?Past Medical History:  ?Diagnosis Date  ? Dementia (HCC)   ? ? ?Past Surgical History:  ?Procedure Laterality Date  ? CHOLECYSTECTOMY    ? TOTAL HIP ARTHROPLASTY Left   ? ? ?No family history on file. ? ?Social History:  reports that he has never smoked. He has never used smokeless tobacco. He reports that he does not drink alcohol and does not use drugs. ? ?Allergies:  ?Allergies  ?Allergen Reactions  ? Demerol [Meperidine Hcl]   ? ? ?Medications: I have reviewed the patient's current medications. ? ?Results for orders placed or performed during the hospital encounter of 03/05/22 (from the past 48 hour(s))  ?Basic metabolic panel     Status: Abnormal  ? Collection Time: 03/05/22  1:24 AM  ?Result Value Ref Range  ? Sodium 139 135 - 145 mmol/L  ? Potassium 5.2 (H) 3.5 - 5.1 mmol/L  ? Chloride 111 98 - 111 mmol/L  ? CO2 20 (L) 22 - 32 mmol/L  ? Glucose, Bld 107 (H) 70 - 99 mg/dL  ?  Comment: Glucose reference range applies only to samples taken after fasting for at least 8 hours.  ? BUN 52 (H) 8 - 23 mg/dL  ? Creatinine, Ser 2.40 (H) 0.61 - 1.24 mg/dL  ? Calcium 8.3 (L) 8.9 - 10.3 mg/dL  ? GFR, Estimated 26 (L) >60 mL/min  ?  Comment: (NOTE) ?Calculated using the CKD-EPI Creatinine Equation (2021) ?  ? Anion gap 8 5 - 15  ?  Comment: Performed at [facility], [address], [place], [place] [address]  ?CBC with Differential     Status: Abnormal  ? Collection Time: 03/05/22  1:24 AM  ?Result Value Ref Range  ? WBC 13.2 (H) 4.0 - 10.5 K/uL  ? RBC 2.82 (L) 4.22 - 5.81 MIL/uL  ?  Hemoglobin 8.9 (L) 13.0 - 17.0 g/dL  ? HCT 29.7 (L) 39.0 - 52.0 %  ? MCV 105.3 (H) 80.0 - 100.0 fL  ? MCH 31.6 26.0 - 34.0 pg  ? MCHC 30.0 30.0 - 36.0 g/dL  ? RDW 15.1 11.5 - 15.5 %  ? Platelets 220 150 - 400 K/uL  ? nRBC 0.0 0.0 - 0.2 %  ? Neutrophils Relative % 80 %  ? Neutro Abs 10.6 (H) 1.7 - 7.7 K/uL  ? Lymphocytes Relative 10 %  ? Lymphs Abs 1.4 0.7 - 4.0 K/uL  ? Monocytes Relative 8 %  ? Monocytes Absolute 1.0 0.1 - 1.0 K/uL  ? Eosinophils Relative 0 %  ? Eosinophils Absolute 0.0 0.0 - 0.5 K/uL  ? Basophils Relative 1 %  ? Basophils Absolute 0.1 0.0 - 0.1 K/uL  ? Immature Granulocytes 1 %  ? Abs Immature Granulocytes 0.09 (H) 0.00 - 0.07 K/uL  ?  Comment: Performed at [facility], [address] [place], [place] [address]  ?Protime-INR     Status: Abnormal  ? Collection Time: 03/05/22  1:24 AM  ?Result Value Ref Range  ? Prothrombin Time 15.4 (H) 11.4 - 15.2  seconds  ? INR 1.2 0.8 - 1.2  ?  Comment: (NOTE) ?INR goal varies based on device and disease states. ?Performed at [facility], [address], [place], [place] ?[address] ?  ?Type and screen [facility]     Status: None  ? Collection Time: 03/05/22  1:51 AM  ?Result Value Ref Range  ? ABO/RH(D) O POS   ? Antibody Screen NEG   ? Sample Expiration    ?  03/08/2022,2359 ?Performed at [facility], [address], [place], [place] [address] ?  ?CBC     Status: Abnormal  ? Collection Time: 03/05/22  8:00 AM  ?Result Value Ref Range  ? WBC 11.2 (H) 4.0 - 10.5 K/uL  ? RBC 2.80 (L) 4.22 - 5.81 MIL/uL  ? Hemoglobin 9.0 (L) 13.0 - 17.0 g/dL  ? HCT 28.7 (L) 39.0 - 52.0 %  ? MCV 102.5 (H) 80.0 - 100.0 fL  ? MCH 32.1 26.0 - 34.0 pg  ? MCHC 31.4 30.0 - 36.0 g/dL  ? RDW 15.0 11.5 - 15.5 %  ? Platelets 229 150 - 400 K/uL  ? nRBC 0.0 0.0 - 0.2 %  ?  Comment: Performed at [facility], [address] [place], [place] [address]  ?Creatinine, serum     Status: Abnormal  ? Collection Time: 03/05/22  8:00 AM  ?Result Value Ref  Range  ? Creatinine, Ser 2.32 (H) 0.61 - 1.24 mg/dL  ? GFR, Estimated 27 (L) >60 mL/min  ?  Comment: (NOTE) ?Calculated using the CKD-EPI Creatinine Equation (2021) ?Performed at [facility], [address], [place], [place] ?[address] ?  ?VITAMIN D 25 Hydroxy (Vit-D Deficiency, Fractures)     Status: None  ? Collection Time: 03/05/22  8:00 AM  ?Result Value Ref Range  ? Vit D, 25-Hydroxy 44.77 30 - 100 ng/mL  ?  Comment: (NOTE) ?Vitamin D deficiency has been defined by the Institute of Medicine  ?and an Endocrine Society practice guideline as a level of serum 25-OH  ?vitamin D less than 20 ng/mL (1,2). The Endocrine Society went on to  ?further define vitamin D insufficiency as a level between 21 and 29  ?ng/mL (2). ? ?1. IOM [employer] of Medicine). 2010. Dietary reference intakes for  ?calcium and D. [place]: [employer]. ?2. Holick MF, [place], Bischoff-Ferrari HA, et al. Evaluation,  ?treatment, and prevention of vitamin D deficiency: an Endocrine  ?Society clinical practice guideline, JCEM. 2011 Jul; 96(7): 1911-30. ? ?Performed at [facility], [address] [place], [place] ?[address] ?  ? ? ?DG Chest 1 View ? ?Result Date: 03/05/2022 ?CLINICAL DATA:  Fall. EXAM: CHEST  1 VIEW COMPARISON:  Chest x-ray 12/23/2021. FINDINGS: The aorta is ectatic, unchanged. Heart is mildly enlarged. There is no focal lung infiltrate, pleural effusion or pneumothorax. Surgical clips are seen in the upper abdomen. No acute fractures are identified. IMPRESSION: 1. No acute cardiopulmonary process. 2. Stable cardiomegaly. Electronically Signed   By: Darliss Cheney M.D.   On: 03/05/2022 01:57  ? ?CT HEAD WO CONTRAST ? ?Result Date: 03/05/2022 ?CLINICAL DATA:  Head trauma. EXAM: CT HEAD WITHOUT CONTRAST TECHNIQUE: Contiguous axial images were obtained from the base of the skull through the vertex without intravenous contrast. RADIATION DOSE REDUCTION: This exam was performed according to the  departmental dose-optimization program which includes automated exposure control, adjustment of the mA and/or kV according to patient size and/or  use of iterative reconstruction technique. COMPARISON:  Head CT dated 12/23/2021. FINDINGS: Brain: Mild age-related atrophy and chronic microvascular ischemic changes. There is no acute intracranial hemorrhage. No mass effect or midline shift. No extra-axial fluid collection. Vascular: No hyperdense vessel or unexpected calcification. Skull: Normal. Negative for fracture or focal lesion. Sinuses/Orbits: Mild diffuse mucoperiosteal thickening of paranasal sinuses. No air-fluid level. The mastoid air cells are clear. Other: None IMPRESSION: 1. No acute intracranial pathology. 2. Mild age-related atrophy and chronic microvascular ischemic changes. Electronically Signed   By: Elgie CollardArash  Radparvar M.D.   On: 03/05/2022 03:28  ? ?CT HIP RIGHT WO CONTRAST ? ?Result Date: 03/05/2022 ?CLINICAL DATA:  Fracture hip, unwitnessed fall. EXAM: CT OF THE RIGHT HIP WITHOUT CONTRAST TECHNIQUE: Multidetector CT imaging of the right hip was performed according to the standard protocol. Multiplanar CT image reconstructions were also generated. RADIATION DOSE REDUCTION: This exam was performed according to the departmental dose-optimization program which includes automated exposure control, adjustment of the mA and/or kV according to patient size and/or use of iterative reconstruction technique. COMPARISON:  Radiographs dated January 05, 2022. FINDINGS: Bones/Joint/Cartilage There is comminuted mildly displaced intertrochanteric fracture of the right femur with superolateral displacement of the distal femur. No pelvic fracture. Sacroiliac joint and pubic symphysis are maintained. The femoral head is located within the acetabular cavity. Ligaments Suboptimally assessed by CT. Muscles and Tendons Intramuscular edema of the gluteus minimus/medius. The remaining muscles and tendons are within normal  limits. Soft tissues Enlarged prostate. IMPRESSION: 1. Mildly comminuted intertrochanteric fracture of the right femur with superolateral displacement of the distal femur. 2. Muscle edema of the gluteus medius/min

## 2022-03-05 NOTE — H&P (Addendum)
?History and Physical  ? ? ?Patient: Jesse Cherry WEX:[id number] DOB: [birth year] ?DOA: 03/05/2022 ?DOS: the patient was seen and examined on 03/05/2022 ?PCP: Bailey Mech, PA-C  ?Patient coming from: SNF ? ?Chief Complaint:  ?Chief Complaint  ?Patient presents with  ? Fall  ?**History primarily obtained from the chart noting patient has severe dementia.  During ER physician assessment family did present to the bedside and were very helpful in assisting with additional history. ? ?HPI: Jesse Cherry is a 88 y.o. male with medical history significant of who presented from a skilled nursing facility memory care unit after EMS staff reported that he was screaming and appeared to be having severe pain.  Important to note that they made sure to report to the EMS staff that "he did not fall on our shift".  Upon EMS arrival they noticed the patient had significant RLE extremity rotation.  ED documented that this was an unwitnessed fall.  EMS did apply traction splint.  Has past medical history of left hip fracture that was repaired surgically in 2019.  Patient does not take any significant medications including no anticoagulants or antiplatelet agents. ? ?In the ER patient was noted to have acute kidney injury/acute renal insufficiency with a potassium of 5.2, a PCO2 of 20, a BUN of 52 and a creatinine of 2.4.  Labs date back to September 2022 with apparent baseline BUN and creatinine 1.71 with normal potassium and normal serum CO2.  Right hip imaging reveals acute right femoral intertrochanteric fracture.  CT right hip reveals a comminuted mildly displaced intertrochanteric fracture of right femur with superolateral displacement of the distal femur.  No pelvic fracture.  Sacroiliac joint and pubic symphysis maintained.  The femoral head was located in the acetabular cavity.  CT head without any acute intracranial pathology.  EDP documents that Dr. Eulah Pont of orthopedic service has been consulted and plans to  see the patient.  It has been made NPO. ? ? ?Review of Systems: As mentioned in the history of present illness. All other systems reviewed and are negative. ?Past Medical History:  ?Diagnosis Date  ? Dementia (HCC)   ? ?Past Surgical History:  ?Procedure Laterality Date  ? CHOLECYSTECTOMY    ? TOTAL HIP ARTHROPLASTY Left   ? ?Social History:  reports that he has never smoked. He has never used smokeless tobacco. He reports that he does not drink alcohol and does not use drugs. ? ?Allergies  ?Allergen Reactions  ? Demerol [Meperidine Hcl]   ? ? ?No family history on file. ? ?Prior to Admission medications   ?Medication Sig Start Date End Date Taking? Authorizing Provider  ?busPIRone (BUSPAR) 5 MG tablet Take 5 mg by mouth at bedtime.   Yes [provider]  ?divalproex (DEPAKOTE) 125 MG DR tablet Take 125 mg by mouth 2 (two) times daily.   Yes [provider]  ?haloperidol (HALDOL) 0.5 MG tablet Take 0.5 mg by mouth every 8 (eight) hours as needed for agitation.   Yes [provider]  ?mirtazapine (REMERON) 15 MG tablet Take 15 mg by mouth at bedtime.   Yes [provider]  ?naproxen (NAPROSYN) 500 MG tablet Take 500 mg by mouth See admin instructions. Bid x 14 days   Yes [provider]  ?polyethylene glycol powder (GLYCOLAX/MIRALAX) 17 GM/SCOOP powder Take 17 g by mouth once.   Yes [provider]  ?Psyllium (METAMUCIL PO) Take 0.4 g by mouth in the morning and at bedtime.   Yes [provider]  ?sennosides-docusate sodium (SENOKOT-S) 8.6-50 MG tablet Take 1 tablet by mouth See admin instructions. At bedtime on Monday,Wednesday and friday   Yes [provider]  ?sertraline (ZOLOFT) 25 MG tablet Take 25 mg by mouth daily.   Yes [provider]  ?traZODone (DESYREL) 50 MG tablet Take 50 mg by mouth at bedtime.   Yes [provider]  ?Vitamin D3 (VITAMIN D) 25 MCG tablet Take 1,000 Units by mouth daily.   Yes [provider]  ?hydrOXYzine (ATARAX/VISTARIL) 10 MG tablet Take 1 tablet (10 mg total) by mouth every 8 (eight) hours as needed. ?Patient not taking: Reported on 03/05/2022 08/10/21   Charlynne PanderYao, David Hsienta, MD  ?zolpidem (AMBIEN) 5 MG tablet Take 1 tablet (5 mg total) by mouth at bedtime as needed for sleep. ?Patient not taking: Reported on 03/05/2022 08/10/21   Charlynne PanderYao, David Hsienta, MD  ? ? ?Physical Exam: ?Vitals:  ? 03/05/22 0630 03/05/22 0645 03/05/22 0700 03/05/22 0750  ?BP: (!) 112/59 101/66 122/62 123/76  ?Pulse: (!) 54 (!) 53 (!) 55 (!) 57  ?Resp: 14   17  ?Temp:    97.8 ?F (36.6 ?C)  ?TempSrc:    Oral  ?SpO2: 95% 96% 95% 98%  ? ?Constitutional: NAD, calm, comfortable when right lower extremity not disturbed ?ENMT: Mucous membranes are very dry.  ?Respiratory: clear to auscultation bilaterally, no wheezing, no crackles. Normal respiratory effort. No accessory muscle use.  ?Cardiovascular: Regular rate and rhythm, no murmurs / rubs / gallops. No extremity edema. 2+ pedal pulses. No carotid bruits.  ?Abdomen: no tenderness, no masses palpated. No hepatosplenomegaly. Bowel sounds positive.  ?Musculoskeletal: no clubbing / cyanosis. No joint deformity upper and lower extremities. Good ROM, no contractures. Normal muscle tone.  RLE in [place] traction splint ?Skin: no rashes, lesions, ulcers. No induration poor skin turgor ?Neurologic: CN 2-12 grossly intact. Sensation intact, DTR normal. Strength 5/5 x all 4 extremities.  ?Psychiatric: Awake and minimally verbal primarily will say ow or stop and nods to questions asked.  Unable to accurately assess orientation. ? ? ?Data Reviewed: ? ?Labs reviewed as above ? ?Assessment and Plan: ?No notes have been filed under this hospital service. ?Service: Hospitalist ? ?Unwitnessed fall with right hip fracture ?Continue traction splint ?Awaiting further evaluation by the orthopedic team ?Current creatinine not too far off from baseline but will continue gentle IV fluid hydration for now ?Would  like to have potassium and CO2 corrected before proceed with anesthesia but this is at discretion of anesthesia team ?Okay for nerve block procedure ?Hip fracture orders initiated ?Continue n.p.o. and IV pain medications for now ?Calcium normal but this is in the context of volume depletion ?Check vitamin D level ? ?Severe dementia ?Mild Atarax for symptoms ?Monitor for decompensation and increased confusion while in the hospital setting and postanesthesia ?Increased fall risk ? ?Acute kidney injury with dehydration/mild hyperkalemia ?Presenting BUN 52 with creatinine of 2.4, decreased CO2 of 20 and potassium of 5.2 all indicative of volume contraction ?Repeat creatinine at 8 AM 2.32 ?Continue to follow labs ? ?Anemia ?Current hemoglobin around 8.9 baseline hemoglobin 11.6 ?Suspect that may be a degree of blood loss from severity of breakage ?Continue to follow hemoglobin especially in the postoperative timeframe ?Anticipate patient will require transfusion postoperatively ? ? Advance Care Planning:   Code Status: DNR EDP confirmed with family at bedside ? ?Consults: Orthopedics/Dr. Eulah PontMurphy contacted by EDP ? ?Family Communication: No family at bedside during H&P but Dr. Preston FleetingGlick with ER did  speak with family who presented to the bedside overnight ? ?DVT prophylaxis: Lovenox with discontinuation of this medication preoperatively at discretion of orthopedic team ? ?Severity of Illness: ?The appropriate patient status for this patient is INPATIENT. Inpatient status is judged to be reasonable and necessary in order to provide the required intensity of service to ensure the patient's safety. The patient's presenting symptoms, physical exam findings, and initial radiographic and laboratory data in the context of their chronic comorbidities is felt to place them at high risk for further clinical deterioration. Furthermore, it is not anticipated that the patient will be medically stable for discharge from the hospital within 2  midnights of admission.  ? ?* I certify that at the point of admission it is my clinical judgment that the patient will require inpatient hospital care spanning beyond 2 midnights from the point of ad

## 2022-03-05 NOTE — Plan of Care (Signed)

## 2022-03-05 NOTE — ED Notes (Signed)
Pt back from nerve block procedure, resting comfortably with eyes closed, NAD, VSS. ?

## 2022-03-05 NOTE — ED Notes (Signed)
Family at bedside. 

## 2022-03-05 NOTE — Anesthesia Preprocedure Evaluation (Signed)
Anesthesia Evaluation Anesthesia Physical Anesthesia Plan Anesthesia Quick Evaluation  

## 2022-03-05 NOTE — ED Notes (Signed)
Pt going for nerve block procedure. ? ?

## 2022-03-05 NOTE — ED Triage Notes (Signed)
Per EMS facility staff stated pt fell "not on their shift" and has been screaming. Per EMS pt's RLE was externally rotated prior to splint being placed. +pulses in RLE ?

## 2022-03-05 NOTE — ED Notes (Signed)
Pt pulled condom cath x 3, replaced. ?

## 2022-03-06 ENCOUNTER — Inpatient Hospital Stay (HOSPITAL_COMMUNITY)

## 2022-03-06 ENCOUNTER — Encounter (HOSPITAL_COMMUNITY)
Admission: EM | Disposition: A | Discharge status: Home Health | Payer: Self-pay | Source: Skilled Nursing Facility | Attending: Internal Medicine

## 2022-03-06 ENCOUNTER — Inpatient Hospital Stay (HOSPITAL_COMMUNITY): Admitting: Certified Registered Nurse Anesthetist

## 2022-03-06 ENCOUNTER — Other Ambulatory Visit: Payer: Self-pay

## 2022-03-06 ENCOUNTER — Encounter (HOSPITAL_COMMUNITY): Payer: Self-pay | Admitting: Internal Medicine

## 2022-03-06 DIAGNOSIS — S72141A Displaced intertrochanteric fracture of right femur, initial encounter for closed fracture: Secondary | ICD-10-CM

## 2022-03-06 DIAGNOSIS — N189 Chronic kidney disease, unspecified: Secondary | ICD-10-CM

## 2022-03-06 DIAGNOSIS — D631 Anemia in chronic kidney disease: Secondary | ICD-10-CM

## 2022-03-06 DIAGNOSIS — S72001A Fracture of unspecified part of neck of right femur, initial encounter for closed fracture: Secondary | ICD-10-CM | POA: Diagnosis not present

## 2022-03-06 DIAGNOSIS — F0390 Unspecified dementia without behavioral disturbance: Secondary | ICD-10-CM

## 2022-03-06 HISTORY — PX: HIP ARTHROPLASTY: SHX981

## 2022-03-06 LAB — BASIC METABOLIC PANEL
Anion gap: 6 (ref 5–15)
Anion gap: 9 (ref 5–15)
BUN: 43 mg/dL — ABNORMAL HIGH (ref 8–23)
BUN: 40 mg/dL — ABNORMAL HIGH (ref 8–23)
CO2: 23 mmol/L (ref 22–32)
CO2: 17 mmol/L — ABNORMAL LOW (ref 22–32)
Calcium: 8.4 mg/dL — ABNORMAL LOW (ref 8.9–10.3)
Calcium: 8 mg/dL — ABNORMAL LOW (ref 8.9–10.3)
Chloride: 113 mmol/L — ABNORMAL HIGH (ref 98–111)
Chloride: 114 mmol/L — ABNORMAL HIGH (ref 98–111)
Creatinine, Ser: 2.02 mg/dL — ABNORMAL HIGH (ref 0.61–1.24)
Creatinine, Ser: 1.88 mg/dL — ABNORMAL HIGH (ref 0.61–1.24)
GFR, Estimated: 32 mL/min — ABNORMAL LOW (ref >60)
GFR, Estimated: 34 mL/min — ABNORMAL LOW (ref >60)
Glucose, Bld: 73 mg/dL (ref 70–99)
Glucose, Bld: 115 mg/dL — ABNORMAL HIGH (ref 70–99)
Potassium: 5.3 mmol/L — ABNORMAL HIGH (ref 3.5–5.1)
Potassium: 5.3 mmol/L — ABNORMAL HIGH (ref 3.5–5.1)
Sodium: 142 mmol/L (ref 135–145)
Sodium: 140 mmol/L (ref 135–145)

## 2022-03-06 LAB — CBC
HCT: 26.2 % — ABNORMAL LOW (ref 39.0–52.0)
Hemoglobin: 8.1 g/dL — ABNORMAL LOW (ref 13.0–17.0)
MCH: 31.4 pg (ref 26.0–34.0)
MCHC: 30.9 g/dL (ref 30.0–36.0)
MCV: 101.6 fL — ABNORMAL HIGH (ref 80.0–100.0)
Platelets: 216 10*3/uL (ref 150–400)
RBC: 2.58 MIL/uL — ABNORMAL LOW (ref 4.22–5.81)
RDW: 14.8 % (ref 11.5–15.5)
WBC: 8.3 10*3/uL (ref 4.0–10.5)
nRBC: 0 % (ref 0.0–0.2)

## 2022-03-06 LAB — PREPARE RBC (CROSSMATCH)

## 2022-03-06 SURGERY — HEMIARTHROPLASTY, HIP, DIRECT ANTERIOR APPROACH, FOR FRACTURE
Anesthesia: General | Site: Hip | Laterality: Right

## 2022-03-06 MED ORDER — ADULT MULTIVITAMIN W/MINERALS CH
1.0000 | ORAL_TABLET | Freq: Every day | ORAL | Status: DC
  Administered 2022-03-07 – 2022-03-09 (×3): 1 via ORAL
  Filled 2022-03-06 (×5): qty 1

## 2022-03-06 MED ORDER — ORAL CARE MOUTH RINSE: 15.0000 mL | Freq: Once | OROMUCOSAL | Status: AC

## 2022-03-06 MED ORDER — LIDOCAINE 2% (20 MG/ML) 5 ML SYRINGE
INTRAMUSCULAR | Status: AC
  Filled 2022-03-06: qty 10

## 2022-03-06 MED ORDER — SODIUM ZIRCONIUM CYCLOSILICATE 10 G PO PACK
10.0000 g | PACK | Freq: Once | ORAL | Status: AC
  Administered 2022-03-06: 10 g via ORAL
  Filled 2022-03-06: qty 1

## 2022-03-06 MED ORDER — PHENYLEPHRINE 80 MCG/ML (10ML) SYRINGE FOR IV PUSH (FOR BLOOD PRESSURE SUPPORT)
PREFILLED_SYRINGE | INTRAVENOUS | Status: AC
  Filled 2022-03-06: qty 10

## 2022-03-06 MED ORDER — SODIUM CHLORIDE 0.9 % IV SOLN: 10.0000 mL/h | Freq: Once | INTRAVENOUS | Status: DC

## 2022-03-06 MED ORDER — CEFAZOLIN SODIUM-DEXTROSE 2-4 GM/100ML-% IV SOLN
2.0000 g | INTRAVENOUS | Status: AC
  Administered 2022-03-06: 2 g via INTRAVENOUS

## 2022-03-06 MED ORDER — CEFAZOLIN SODIUM-DEXTROSE 1-4 GM/50ML-% IV SOLN: 1.0000 g | Freq: Three times a day (TID) | INTRAVENOUS | Status: DC

## 2022-03-06 MED ORDER — SODIUM CHLORIDE 0.9 % IR SOLN
Status: DC | PRN
  Administered 2022-03-06: 3000 mL

## 2022-03-06 MED ORDER — DEXAMETHASONE SODIUM PHOSPHATE 10 MG/ML IJ SOLN
INTRAMUSCULAR | Status: AC
  Filled 2022-03-06: qty 3

## 2022-03-06 MED ORDER — FENTANYL CITRATE (PF) 250 MCG/5ML IJ SOLN
INTRAMUSCULAR | Status: DC | PRN
  Administered 2022-03-06: 100 ug via INTRAVENOUS
  Administered 2022-03-06: 50 ug via INTRAVENOUS
  Administered 2022-03-06: 100 ug via INTRAVENOUS

## 2022-03-06 MED ORDER — PROPOFOL 10 MG/ML IV BOLUS
INTRAVENOUS | Status: DC | PRN
  Administered 2022-03-06: 100 mg via INTRAVENOUS

## 2022-03-06 MED ORDER — CEFAZOLIN SODIUM-DEXTROSE 1-4 GM/50ML-% IV SOLN
1.0000 g | Freq: Three times a day (TID) | INTRAVENOUS | Status: AC
  Administered 2022-03-07: 1 g via INTRAVENOUS
  Filled 2022-03-06: qty 50

## 2022-03-06 MED ORDER — ACETAMINOPHEN 325 MG PO TABS: 650.0000 mg | ORAL_TABLET | Freq: Four times a day (QID) | ORAL | Status: DC | PRN

## 2022-03-06 MED ORDER — ROCURONIUM BROMIDE 10 MG/ML (PF) SYRINGE
PREFILLED_SYRINGE | INTRAVENOUS | Status: AC
  Filled 2022-03-06: qty 10

## 2022-03-06 MED ORDER — PHENYLEPHRINE HCL-NACL 20-0.9 MG/250ML-% IV SOLN
INTRAVENOUS | Status: DC | PRN
  Administered 2022-03-06: 50 ug/min via INTRAVENOUS

## 2022-03-06 MED ORDER — DEXAMETHASONE SODIUM PHOSPHATE 10 MG/ML IJ SOLN
INTRAMUSCULAR | Status: DC | PRN
Start: 2022-03-06 — End: 2022-03-06
  Administered 2022-03-06: 8 mg via INTRAVENOUS

## 2022-03-06 MED ORDER — ROCURONIUM BROMIDE 10 MG/ML (PF) SYRINGE
PREFILLED_SYRINGE | INTRAVENOUS | Status: DC | PRN
  Administered 2022-03-06: 40 mg via INTRAVENOUS
  Administered 2022-03-06: 30 mg via INTRAVENOUS

## 2022-03-06 MED ORDER — 0.9 % SODIUM CHLORIDE (POUR BTL) OPTIME
TOPICAL | Status: DC | PRN
  Administered 2022-03-06: 500 mL
  Administered 2022-03-06: 1000 mL

## 2022-03-06 MED ORDER — CEFAZOLIN SODIUM-DEXTROSE 2-4 GM/100ML-% IV SOLN
INTRAVENOUS | Status: AC
  Filled 2022-03-06: qty 100

## 2022-03-06 MED ORDER — SODIUM CHLORIDE 0.9 % IV SOLN: INTRAVENOUS | Status: DC

## 2022-03-06 MED ORDER — OXYCODONE HCL 5 MG PO TABS
2.5000 mg | ORAL_TABLET | ORAL | Status: DC | PRN
  Administered 2022-03-06 – 2022-03-07 (×2): 5 mg via ORAL
  Administered 2022-03-07: 2.5 mg via ORAL
  Administered 2022-03-07 (×2): 5 mg via ORAL
  Filled 2022-03-06 (×5): qty 1

## 2022-03-06 MED ORDER — ONDANSETRON HCL 4 MG/2ML IJ SOLN
INTRAMUSCULAR | Status: AC
  Filled 2022-03-06: qty 6

## 2022-03-06 MED ORDER — CHLORHEXIDINE GLUCONATE 4 % EX LIQD: 60.0000 mL | Freq: Once | CUTANEOUS | Status: DC

## 2022-03-06 MED ORDER — KETOROLAC TROMETHAMINE 30 MG/ML IJ SOLN
INTRAMUSCULAR | Status: AC
  Filled 2022-03-06: qty 1

## 2022-03-06 MED ORDER — FENTANYL CITRATE (PF) 100 MCG/2ML IJ SOLN: 25.0000 ug | INTRAMUSCULAR | Status: DC | PRN

## 2022-03-06 MED ORDER — CHLORHEXIDINE GLUCONATE 0.12 % MT SOLN
OROMUCOSAL | Status: AC
  Administered 2022-03-06: 15 mL via OROMUCOSAL
  Filled 2022-03-06: qty 15

## 2022-03-06 MED ORDER — FENTANYL CITRATE (PF) 100 MCG/2ML IJ SOLN
100.0000 ug | Freq: Once | INTRAMUSCULAR | Status: DC
Start: 2022-03-06 — End: 2022-03-06

## 2022-03-06 MED ORDER — EPHEDRINE 5 MG/ML INJ
INTRAVENOUS | Status: AC
  Filled 2022-03-06: qty 5

## 2022-03-06 MED ORDER — FENTANYL CITRATE (PF) 250 MCG/5ML IJ SOLN
INTRAMUSCULAR | Status: AC
  Filled 2022-03-06: qty 5

## 2022-03-06 MED ORDER — ONDANSETRON HCL 4 MG/2ML IJ SOLN: 4.0000 mg | Freq: Once | INTRAMUSCULAR | Status: DC | PRN

## 2022-03-06 MED ORDER — TRANEXAMIC ACID-NACL 1000-0.7 MG/100ML-% IV SOLN
1000.0000 mg | INTRAVENOUS | Status: AC
  Administered 2022-03-06: 1000 mg via INTRAVENOUS

## 2022-03-06 MED ORDER — ACETAMINOPHEN 10 MG/ML IV SOLN: 1000.0000 mg | Freq: Once | INTRAVENOUS | Status: DC | PRN

## 2022-03-06 MED ORDER — POVIDONE-IODINE 10 % EX SWAB
2.0000 "application " | Freq: Once | CUTANEOUS | Status: AC
  Administered 2022-03-06: 2 via TOPICAL

## 2022-03-06 MED ORDER — BUPIVACAINE HCL (PF) 0.25 % IJ SOLN
INTRAMUSCULAR | Status: AC
  Filled 2022-03-06: qty 30

## 2022-03-06 MED ORDER — LIDOCAINE 2% (20 MG/ML) 5 ML SYRINGE
INTRAMUSCULAR | Status: DC | PRN
Start: 2022-03-06 — End: 2022-03-06
  Administered 2022-03-06: 60 mg via INTRAVENOUS

## 2022-03-06 MED ORDER — BUPIVACAINE HCL (PF) 0.25 % IJ SOLN
INTRAMUSCULAR | Status: DC | PRN
  Administered 2022-03-06: 30 mL

## 2022-03-06 MED ORDER — ONDANSETRON HCL 4 MG/2ML IJ SOLN
INTRAMUSCULAR | Status: DC | PRN
Start: 2022-03-06 — End: 2022-03-06
  Administered 2022-03-06: 4 mg via INTRAVENOUS

## 2022-03-06 MED ORDER — TRANEXAMIC ACID-NACL 1000-0.7 MG/100ML-% IV SOLN
INTRAVENOUS | Status: AC
  Filled 2022-03-06: qty 100

## 2022-03-06 MED ORDER — CHLORHEXIDINE GLUCONATE 0.12 % MT SOLN: 15.0000 mL | Freq: Once | OROMUCOSAL | Status: AC

## 2022-03-06 MED ORDER — SUGAMMADEX SODIUM 200 MG/2ML IV SOLN
INTRAVENOUS | Status: DC | PRN
  Administered 2022-03-06: 149.4 mg via INTRAVENOUS

## 2022-03-06 SURGICAL SUPPLY — 59 items
ADH SKN CLS APL DERMABOND .7 (GAUZE/BANDAGES/DRESSINGS) ×1
APL PRP STRL LF DISP 70% ISPRP (MISCELLANEOUS) ×2
BLADE SAGITTAL 25.0X1.27X90 (BLADE) ×2 IMPLANT
BRUSH FEMORAL CANAL (MISCELLANEOUS) ×1 IMPLANT
CEMENT RESTRICTOR BONE PREP ST (Cement) ×1 IMPLANT
CHLORAPREP W/TINT 26 (MISCELLANEOUS) ×4 IMPLANT
COVER SURGICAL LIGHT HANDLE (MISCELLANEOUS) ×2 IMPLANT
DERMABOND ADVANCED (GAUZE/BANDAGES/DRESSINGS) ×1
DERMABOND ADVANCED .7 DNX12 (GAUZE/BANDAGES/DRESSINGS) ×1 IMPLANT
DRAPE HALF SHEET 40X57 (DRAPES) ×2 IMPLANT
DRAPE HIP W/POCKET STRL (MISCELLANEOUS) ×2 IMPLANT
DRAPE IMP U-DRAPE 54X76 (DRAPES) ×2 IMPLANT
DRAPE INCISE IOBAN 66X45 STRL (DRAPES) ×2 IMPLANT
DRAPE INCISE IOBAN 85X60 (DRAPES) ×2 IMPLANT
DRAPE U-SHAPE 47X51 STRL (DRAPES) ×4 IMPLANT
DRSG AQUACEL AG ADV 3.5X10 (GAUZE/BANDAGES/DRESSINGS) ×1 IMPLANT
ELECT BLADE 4.0 EZ CLEAN MEGAD (MISCELLANEOUS) ×2
ELECT REM PT RETURN 15FT ADLT (MISCELLANEOUS) ×2 IMPLANT
ELECTRODE BLDE 4.0 EZ CLN MEGD (MISCELLANEOUS) ×1 IMPLANT
FEMORAL HEAD LFIT (Head) ×2 IMPLANT
GLOVE SRG 8 PF TXTR STRL LF DI (GLOVE) ×1 IMPLANT
GLOVE SURG ORTHO LTX SZ8 (GLOVE) ×4 IMPLANT
GLOVE SURG UNDER POLY LF SZ8 (GLOVE) ×2
GOWN STRL REUS W/ TWL XL LVL3 (GOWN DISPOSABLE) ×1 IMPLANT
GOWN STRL REUS W/TWL XL LVL3 (GOWN DISPOSABLE) ×2
HANDPIECE INTERPULSE COAX TIP (DISPOSABLE) ×2
HEAD FEM 4XOFST TPR 28X (Head) IMPLANT
HEAD UNIV BIP OD 28X51 (Head) ×1 IMPLANT
HOOD PEEL AWAY FLYTE STAYCOOL (MISCELLANEOUS) ×6 IMPLANT
IRRIGATION SURGIPHOR STRL (IV SOLUTION) IMPLANT
KIT BASIN OR (CUSTOM PROCEDURE TRAY) ×2 IMPLANT
KIT TURNOVER KIT A (KITS) ×2 IMPLANT
MANIFOLD NEPTUNE II (INSTRUMENTS) ×2 IMPLANT
MARKER SKIN DUAL TIP RULER LAB (MISCELLANEOUS) ×2 IMPLANT
NDL 18GX1X1/2 (RX/OR ONLY) (NEEDLE) IMPLANT
NEEDLE 18GX1X1/2 (RX/OR ONLY) (NEEDLE) ×2 IMPLANT
NS IRRIG 1000ML POUR BTL (IV SOLUTION) ×2 IMPLANT
PACK TOTAL JOINT (CUSTOM PROCEDURE TRAY) ×2 IMPLANT
RETRIEVER SUT HEWSON (MISCELLANEOUS) ×2 IMPLANT
SEALER BIPOLAR AQUA 6.0 (INSTRUMENTS) ×2 IMPLANT
SET HNDPC FAN SPRY TIP SCT (DISPOSABLE) IMPLANT
SET INTERPULSE LAVAGE W/TIP (ORTHOPEDIC DISPOSABLE SUPPLIES) ×2 IMPLANT
SLEEVE CABLE 2MM VT (Orthopedic Implant) ×2 IMPLANT
SPONGE T-LAP 18X18 ~~LOC~~+RFID (SPONGE) ×4 IMPLANT
STEM FEM CEMT 49X158 SZ6 127D (Stem) ×1 IMPLANT
STOCKINETTE IMPERVIOUS LG (DRAPES) ×2 IMPLANT
SUCTION FRAZIER HANDLE 10FR (MISCELLANEOUS) ×1
SUCTION TUBE FRAZIER 10FR DISP (MISCELLANEOUS) IMPLANT
SUT ETHIBOND 2 V 37 (SUTURE) ×2 IMPLANT
SUT MNCRL AB 3-0 PS2 18 (SUTURE) ×2 IMPLANT
SUT STRATAFIX 1PDS 45CM VIOLET (SUTURE) ×4 IMPLANT
SUT VIC AB 0 CT1 27 (SUTURE) ×4
SUT VIC AB 0 CT1 27XBRD ANBCTR (SUTURE) ×1 IMPLANT
SUT VIC AB 2-0 CT2 27 (SUTURE) ×5 IMPLANT
TOWEL GREEN STERILE (TOWEL DISPOSABLE) ×2 IMPLANT
TOWER CARTRIDGE SMART MIX (DISPOSABLE) ×1 IMPLANT
TRAY FOLEY MTR SLVR 16FR STAT (SET/KITS/TRAYS/PACK) ×1 IMPLANT
TUBE SUCT ARGYLE STRL (TUBING) ×2 IMPLANT
WATER STERILE IRR 1000ML POUR (IV SOLUTION) ×2 IMPLANT

## 2022-03-06 NOTE — Interval H&P Note (Signed)
The patient has been re-examined, and the chart reviewed, and there have been no interval changes to the documented history and physical.   ? ?Patient with intertrochanteric femur fracture with femoral neck extension.  Given the significant femoral neck extension discussed concerns for nonunion and poor fixation with cephalomedullary nail fixation.  Ultimately felt that hip hemiarthroplasty would provide the best immediate fixation.  Discussed need for possible fixation of the trochanter with cerclage cables or plate which are both available for the case.  Hemiarthroplasty favored given the patient's severe cognitive and health issues, to minimize the length of surgery and provide best stability postoperatively. ? ?The operative side was examined and the patient was confirmed to have. Spontaneous motor EHL, ext, flex 5/5, and DP 2+ No significant edema.  Patient not cooperative with sensory exam. ? ? ?The risks, benefits, and alternatives have been discussed at length with patient's daughter, and they are willing to proceed.  R hip marked. Consent has been signed. ? ?

## 2022-03-06 NOTE — Anesthesia Preprocedure Evaluation (Addendum)
Anesthesia Evaluation  ?Patient identified by MRN, date of birth, ID band ?Patient confused ? ? ? ?Reviewed: ?Allergy & Precautions, NPO status , Patient's Chart, lab work & pertinent test results ? ?Airway ?Mallampati: Unable to assess ? ?TM Distance: >3 FB ?Neck ROM: Full ? ? ? Dental ? ?(+) Missing ?  ?Pulmonary ?neg pulmonary ROS,  ?  ?Pulmonary exam normal ? ? ? ? ? ? ? Cardiovascular ?negative cardio ROS ?Normal cardiovascular exam ? ? ?  ?Neuro/Psych ?PSYCHIATRIC DISORDERS Dementia negative neurological ROS ?   ? GI/Hepatic ?negative GI ROS, Neg liver ROS,   ?Endo/Other  ?negative endocrine ROS ? Renal/GU ?CRFRenal disease  ? ?  ?Musculoskeletal ? ? Abdominal ?  ?Peds ? Hematology ? ?(+) Blood dyscrasia, anemia ,   ?Anesthesia Other Findings ?closed intertrochanteric fracture right hip ? Reproductive/Obstetrics ? ?  ? ? ? ? ? ? ? ? ? ? ? ? ? ?  ?  ? ? ? ? ? ? ?Anesthesia Physical ?Anesthesia Plan ? ?ASA: 3 ? ?Anesthesia Plan: General  ? ?Post-op Pain Management:   ? ?Induction: Intravenous ? ?PONV Risk Score and Plan: 2 and Ondansetron, Dexamethasone and Treatment may vary due to age or medical condition ? ?Airway Management Planned: Oral ETT ? ?Additional Equipment:  ? ?Intra-op Plan:  ? ?Post-operative Plan: Extubation in OR ? ?Informed Consent: I have reviewed the patients History and Physical, chart, labs and discussed the procedure including the risks, benefits and alternatives for the proposed anesthesia with the patient or authorized representative who has indicated his/her understanding and acceptance.  ? ?Patient has DNR.  ?Discussed DNR with power of attorney. ?  ?Dental advisory given and Consent reviewed with POA ? ?Plan Discussed with: CRNA ? ?Anesthesia Plan Comments: (Family wishes for no defibrillation )  ? ? ? ? ? ? ?Anesthesia Quick Evaluation ? ?

## 2022-03-06 NOTE — Plan of Care (Signed)

## 2022-03-06 NOTE — Progress Notes (Signed)
Manufacturing engineer [facility]) Hospital Liaison: RN note    ? ?This is a current [facility] hospice patient from [place] memory care with a terminal diagnosis of protein calorie malnutrition. He was transported to ED for evaluation following an unwitnessed fall. Hospice was not notified. He  was admitted to the hospital on 4/19 with a diagnosis of a fractured right femur. Per Dr. Gildardo Cranker this is a related hospital admission.  ? ?Unable to visit at bedside due to patient in OR for surgery followed by recovery in PACU.  ? ?VS: 96.8, 120/57, 56, 16, 95%2Lnc ?I/O: 100/800 ? ?Abnormal Labs: ?03/06/22 08:38 ?Potassium: 5.3 (H) ?Chloride: 113 (H) ?BUN: 43 (H) ?Creatinine: 2.02 (H) ?Calcium: 8.4 (L) ?GFR, Estimated: 32 (L) ?RBC: 2.58 (L) ?Hemoglobin: 8.1 (L) ?HCT: 26.2 (L) ?MCV: 101.6 (H) ? ?Diagnostics: ?Hip XR ?IMPRESSION: ?1. Mildly comminuted intertrochanteric fracture of the right femur ?with superolateral displacement of the distal femur. ?2. Muscle edema of the gluteus medius/minimus. The remaining muscles ?and tendons are within normal limits. ?3.  Enlarged prostate. ? ?IV/PRN Medications: NS@ 72ml/hr, HYDROcodone-acetaminophen  5.325 PRN @ [id number], Melatonin 3mg  PRN @ [id number] ? ?Problem list: ?Fall/right hip fracture: Patient with severe pain of right hip, deformity /decreased range of motion.  Unwitnessed fall at his nursing facility.  Continue pain management, supportive care.  Orthopedics already following, planning for ORIF.  PT/OT evaluation after surgery, DVT prophylaxis after surgery. ?  ?AKI on CKD stage 3b/hyperkalemia: Continue IV fluids, continue to monitor.  Initially specimen was hemolyzed which would have contributed to hyperkalemia. ?Potassium of 5.3 today. ?His baseline creatinine ranges from 1.6-1.7.  Presented with creatinine of 2.4, improving with IV fluids.  We will check another potassium level at 6 PM ?  ?Severe dementia: Continue delirium precautions, supportive care, frequent reorientation.   Takes buspirone, Depakote, Haldol, rameron, sertraline, trazodone at rehab center.  We will check and restart after surgery ?  ?Normocytic  anemia: Baseline hemoglobin in the range of 11, hemoglobin has dropped most likely secondary to acute blood loss at fracture site.  Continue to monitor and transfuse if needed ? ?Discharge planning: ongoing. ?Family contact: Spoke with daughter by phone. ?IDG: updated. ?GOC: DNR  ?  ?Should patient need ambulance transport at discharge please use [facility] as [facility] contracts this service with them for our active hospice patients. ? ?A Please do not hesitate to call with questions.    ?  ?Thank you,    ?  ?Farrel Gordon, RN, CCM       ?[facility] Hospital Liaison    ?[phone]  ? ? ?

## 2022-03-06 NOTE — Progress Notes (Signed)
PHARMACY NOTE:  ANTIMICROBIAL RENAL DOSAGE ADJUSTMENT ? ?Current antimicrobial regimen includes a mismatch between antimicrobial dosage and estimated renal function.  As per policy approved by the Pharmacy & Therapeutics and Medical Executive Committees, the antimicrobial dosage will be adjusted accordingly. ? ?Current antimicrobial dosage:  cefazolin 2g IV q8 x2 ? ?Indication: post-op prophylaxis ? ?Renal Function: ? ?Estimated Creatinine Clearance: 25 mL/min (A) (by C-G formula based on SCr of 2.02 mg/dL (H)). ?[]      On intermittent HD, scheduled: ?[]      On CRRT ?   ?Antimicrobial dosage has been changed to:  Cefazolin 1g IV x1 ? ?Additional comments: ? ? , PharmD, BCIDP, AAHIVP, CPP ?Infectious Disease Pharmacist ?03/06/2022 6:00 PM ? ? ? ? ?  ? ?

## 2022-03-06 NOTE — Op Note (Addendum)
03/06/22 ? ?3:50 PM ? ?PATIENT:  Jesse Cherry  ? ?MRN: [id number] ? ?PRE-OPERATIVE DIAGNOSIS: Right closed intertrochanteric femur fracture with transcervical femoral neck fracture ? ?POST-OPERATIVE DIAGNOSIS:  ? ?PROCEDURE:  Procedure(s): ?Right cemented bipolar hemiarthroplasty with ORIF greater troches fracture with cerclage cable, prophylactic cabling of the proximal femur. ? ?PREOPERATIVE INDICATIONS:  Jesse Cherry is an 88 y.o. male who was admitted 03/05/2022 with a diagnosis of Right closed intertrochanteric femur fracture with transcervical femoral neck fracture and elected for surgical management.  The risks benefits and alternatives were discussed with the patient's daughter including but not limited to the risks of nonoperative treatment, versus surgical intervention including infection, bleeding, nerve injury, periprosthetic fracture, the need for revision surgery, dislocation, leg length discrepancy, blood clots, cardiopulmonary complications, morbidity, mortality, among others, and they were willing to proceed.  Predicted outcome is good, although there will be at least a six to nine month expected recovery.  ? ?OPERATIVE REPORT  ?   ?SURGEON:  Charlies Constable, MD ?   ?ASSISTANT: Izola Price, RNFA (Present throughout the entire procedure,  necessary for completion of procedure in a timely manner, assisting with retraction, instrumentation, and closure)  ?   ?ANESTHESIA: General ? ?ESTIMATED BLOOD LOSS: 500 cc ?   ?COMPLICATIONS:  None.  ? ?UNIQUE ASPECTS OF THE CASE: Right intertrochanteric femur fracture with extension into the transcervical and subcapital femoral neck to the inferior and anterior cortex ?   ? ?COMPONENTS:  ?Cemented St[employer]ker Accolade C size 6 127 degree, 51 bipolar Hemi head 28-4 cobalt chrome inner ball, 2.0 cerclage cables x3 ?Implant Name Type Inv. Item Serial No. Manufacturer Lot No. LRB No. Used Action  ?SLEEVE CABLE 2MM VT - [id number] Orthopedic Implant SLEEVE CABLE 2MM VT   [employer] [id number] Right 1 Implanted  ?SLEEVE CABLE 2MM VT - [id number] Orthopedic Implant SLEEVE CABLE 2MM VT  [employer] [id number] Right 1 Implanted  ?FEMORAL HEAD LFIT - [id number] Head FEMORAL HEAD LFIT  [employer] [id number] Right 1 Implanted  ?HEAD UNIV BIP OD [id number] - [id number] Head HEAD UNIV BIP OD 28X51  [employer] [id number] Right 1 Implanted  ?STEM FEM CEMT 02H852 SZ6 127D - [id number] Stem STEM FEM CEMT [id number] SZ6 127D  [employer] [id number] Right 1 Implanted  ? ?   ?PROCEDURE IN DETAIL: The patient was met in the holding area and identified.  The appropriate hip  was marked at the operative site. The patient was then transported to the OR and  placed under anesthesia.  At that point, the patient was  placed in the lateral decubitus position with the operative side up and  secured to the operating room table and all bony prominences padded. A subaxillary role was placed. ?   ?The operative lower extremity was prepped from the iliac crest to the ankle.  Sterile draping was performed.  2g of ancef and 1g TXA were given prior to incision. Time out was performed prior to incision.   ?   ?A routine posterolateral approach was utilized via sharp dissection  carried down to the subcutaneous tissue.  Gross bleeders were Bovie  coagulated.  The iliotibial band was identified and incised  along the length of the skin incision.  A Charnley retractor was inserted with care to protect the sciatic nerve.  With the hip internally rotated, the short external rotators  were identified. The hip capsule released in a T-type fashion, and posterior sleeve of the capsule was tagged. ? ?The femoral neck was exposed, and I  resected the femoral neck using the appropriate jig to approximately 1cm above the lesser trochanter. Notably all of the medial and anterior neck was all intact to the distal femur. A 1.5cm segment of posterior neck was missing as part of the femoral neck fracture down to the  level of the lesser troch with no further extension. ? ?The femoral head was then removed with a corkscrew. ?   ?I then exposed the deep acetabulum, cleared out any tissue including the ligamentum teres. ? ?Prior to stem preparation for the femoral component elected to place a prophylactic cable circumferentially just distal to the lesser trochanter.  This cable was also passed under the vastus lateralis.  It was tensioned and cut. ?   ?I then prepared the proximal femur using the box cutter, Charnley awl, and then sequentially broached. ? ?A trial utilized, and I reduced the hip, leg lengths were assessed clinically and felt to be equal. The hip was then taken through a full range of motion, the hip was stable at full extension and 90 degrees external rotation without anterior subluxation. The hip was also stable in the position of sleep, and in neutral abduction up to 90 degrees flexion, and 80 degrees IR. The trial components were then removed.  ? ?We then prepared canal for cementation.  The cement restrictor was measured and inserted distally.  The canal was then irrigated with the pulse lavage and 3 L of normal saline.  2 bags of Simplex cement were prepared.  Using the cement gun the cement was inserted distally and the canal was filled.  We then pressurized the canal. The real implant was then inserted matching the patient's native anteversion of approximately 30 degrees.  We then waited for 13 minutes for the cement to be fully set.  Excess cement was removed.  A lap was placed in the acetabulum prior to cementing was also removed and the acetabulum was assessed to make sure there was no cement or bone fragments. ? ?Prior to impacting the femoral head assess the stability of the greater trochanter.  There was a large posterior greater trochanter piece that had some mobility.  It had good tendinous attachment to the abductors and periosteal attachment to the rest of the trochanter.  Elected to further  stabilize this tube additional 2 oh cerclage cables passed one around the greater trochanter and another 1 above the lesser trochanter. ? ?The hip was then reduced with the trial head again and taken through functional range of motion and found to have excellent stability. Leg lengths were restored. The real head was then impacted onto the stem and the hip was again reduced. ? ?The capsule was then repaired with #2 Ethibond. Excellent posterior capsular repair was achieved.  ? ?I then irrigated the hip copiously again with pulse lavage. The wounds were injected with 30cc of 0.25% marcaine. The fascia and IT band was repaired with #1 stratafix, followed by 0 stratafix for the fat layer followed by 2-0 Vicryl and running 3-0 Monocryl for the skin, Dermabond was applied and an Aquacel dressing was placed.  The patient was then awakened and returned to PACU in stable and satisfactory condition. There were no complications. ? ?Post op recs: ?WB: WBAT  ?Abx: ancef x23 hours post op ?Imaging: PACU xrays ?Dressing: Aquacel dressing to be kept intact until follow-up ?DVT prophylaxis: lovenox starting POD1 x4 weeks ?Follow up: 2 weeks after surgery for a wound check with Dr. Zachery Dakins at [facility].  ?Address: [address], [place], [place] [address]  ?Office Phone: [phone] ? ? ?Charlies Constable, MD ?Orthopedic Surgeon ? ?03/06/2022 3:50 PM ? ?  ?

## 2022-03-06 NOTE — Progress Notes (Signed)
Civil engineer, contracting [facility]) Hospital Liaison: RN note    ? ?This is a current [facility] hospice patient. [facility] hospital liaisons will continue to follow. ? ?A Please do not hesitate to call with questions.    ? ?Thank you,    ? ?Elsie Saas, RN, CCM       ? ?[facility] Hospital Liaison    ? ?[phone]- [phone]  ? ?   ?

## 2022-03-06 NOTE — Transfer of Care (Signed)
Immediate Anesthesia Transfer of Care Note ? ?Patient: Jesse Cherry ? ?Procedure(s) Performed: ARTHROPLASTY BIPOLAR HIP (HEMIARTHROPLASTY) (Right: Hip) ? ?Patient Location: PACU ? ?Anesthesia Type:General ? ?Level of Consciousness: awake ? ?Airway & Oxygen Therapy: Patient Spontanous Breathing ? ?Post-op Assessment: Report given to RN and Post -op Vital signs reviewed and stable ? ?Post vital signs: Reviewed and stable ? ?Last Vitals:  ?Vitals Value Taken Time  ?BP 120/57 03/06/22 1628  ?Temp    ?Pulse 61 03/06/22 1633  ?Resp 8 03/06/22 1633  ?SpO2 93 % 03/06/22 1633  ?Vitals shown include unvalidated device data. ? ?Last Pain:  ?Vitals:  ? 03/06/22 1242  ?TempSrc: Oral  ?PainSc:   ?   ? ?  ? ?Complications: No notable events documented. ?

## 2022-03-06 NOTE — Discharge Instructions (Signed)
Diet: As you were doing prior to hospitalization  ? ?Shower:  May shower but keep the wounds dry, use an occlusive plastic wrap, NO SOAKING IN TUB.  If the bandage gets wet, change with a clean dry gauze.    ? ?Dressing:  leave dressing on until follow up, 2-3 weeks after surgery. ? ?Activity:  Increase activity slowly as tolerated, but follow the weight bearing instructions below.  The rules on driving is that you can not be taking narcotics while you drive, and you must feel in control of the vehicle.   ? ?Weight Bearing:   Weight bearing as tolerated..   ? ?Blood clot prevention (DVT Prophylaxis): After surgery you are at an increased risk for a blood clot. you were prescribed a blood thinner, lovenox 40mg , to be taken once daily for a total of 4 weeks from surgery to help reduce your risk of getting a blood clot. This will help prevent a blood clot. Signs of a pulmonary embolus (blood clot in the lungs) include sudden short of breath, feeling lightheaded or dizzy, chest pain with a deep breath, rapid pulse rapid breathing. Signs of a blood clot in your arms or legs include new unexplained swelling and cramping, warm, red or darkened skin around the painful area. Please call the office or 911 right away if these signs or symptoms develop. ?To prevent constipation: you may use a stool softener such as - ? ?Colace (over the counter) 100 mg by mouth twice a day  ?Drink plenty of fluids (prune juice may be helpful) and high fiber foods ?Miralax (over the counter) for constipation as needed.   ? ?Itching:  If you experience itching with your medications, try taking only a single pain pill, or even half a pain pill at a time.  You may take up to 10 pain pills per day, and you can also use benadryl over the counter for itching or also to help with sleep.  ? ?Precautions:  If you experience chest pain or shortness of breath - call 911 immediately for transfer to the hospital emergency department!! ? ? ?Call office  [phone]) for the following: ?Temperature greater than 101F ?Persistent nausea and vomiting ?Severe uncontrolled pain ?Redness, tenderness, or signs of infection (pain, swelling, redness, odor or green/yellow discharge around the site) ?Difficulty breathing, headache or visual disturbances ?Hives ?Persistent dizziness or light-headedness ?Extreme fatigue ?Any other questions or concerns you may have after discharge ? ?In an emergency, call 911 or go to an Emergency Department at a nearby hospital ? ?Follow- Up Appointment:  Please call for an appointment to be seen approximately 2-3 week after surgery in [facility] with your surgeon Dr. Charlies Constable - [phone] ?Address: [address] [place], [place], [place] [address] ? ? ?  ?

## 2022-03-06 NOTE — Progress Notes (Signed)
?PROGRESS NOTE ? ?Jesse Cherry  [id number] DOB: June 04, 1933 DOA: 03/05/2022 ?PCP: Bailey Mech, PA-C  ? ?Brief Narrative: ?Patient is a 88 year old male with history of advanced dementia who presented from skilled nursing facility memory care after he complained of severe pain on the right hip, found to have right lower extremity rotation after an unwitnessed fall.  On presentation, he was hemodynamically stable.  Right hip x-ray showed acute right femoral intertrochanteric fracture.  CT negative revealed comminuted mildly displaced intertrochanteric fracture of right femur.  CT head without any acute intracranial abnormalities.  Lab work also showed AKI, hyperkalemia.  Orthopedics consulted, plan for ORIF ? ?Assessment & Plan: ? ?Principal Problem: ?  Hip fracture (HCC) ?Active Problems: ?  Closed right hip fracture (HCC) ? ?Fall/right hip fracture: Patient with severe pain of right hip, deformity /decreased range of motion.  Unwitnessed fall at his nursing facility.  Continue pain management, supportive care.  Orthopedics already following, planning for ORIF.  PT/OT evaluation after surgery, DVT prophylaxis after surgery. ? ?AKI on CKD stage 3b/hyperkalemia: Continue IV fluids, continue to monitor.  Initially specimen was hemolyzed which would have contributed to hyperkalemia. ?Potassium of 5.3 today. ?His baseline creatinine ranges from 1.6-1.7.  Presented with creatinine of 2.4, improving with IV fluids.  We will check another potassium level at 6 PM ? ?Severe dementia: Continue delirium precautions, supportive care, frequent reorientation.  Takes buspirone, Depakote, Haldol, rameron, sertraline, trazodone at rehab center.  We will check and restart after surgery ? ?Normocytic  anemia: Baseline hemoglobin in the range of 11, hemoglobin has dropped most likely secondary to acute blood loss at fracture site.  Continue to monitor and transfuse if needed ? ? ?  ?  ?  ? ?DVT prophylaxis:enoxaparin  (LOVENOX) injection 30 mg Start: 03/05/22 1000 ? ? ?  Code Status: DNR ? ?Family Communication: None at bedside ? ?Patient status:Inpatient ? ?Patient is from :SNF ? ?Anticipated discharge to:SNF ? ?Estimated DC date:2-3 days ? ? ?Consultants: Orthopedics ? ?Procedures:Plan for ORIF ? ?Antimicrobials:  ?Anti-infectives (From admission, onward)  ? ? None  ? ?  ? ? ?Subjective: ?Patient seen and examined at the bedside this morning.  Hemodynamically stable during evaluation.  Sitter at the bedside.  He was lying in bed, pain well controlled.  Open his eyes on calling his name but completely disoriented.  Looks overall comfortable ? ?Objective: ?Vitals:  ? 03/05/22 1800 03/05/22 2049 03/06/22 0300 03/06/22 0705  ?BP:  102/62 (!) 105/57 126/65  ?Pulse:  63 61 64  ?Resp:  12 12 12   ?Temp:  98.1 ?F (36.7 ?C) 98.3 ?F (36.8 ?C) (!) 97.5 ?F (36.4 ?C)  ?TempSrc:  Oral Oral Oral  ?SpO2:  91% 95% 99%  ?Weight: 74.7 kg     ?Height: 5' 7.5" (1.715 m)     ? ? ?Intake/Output Summary (Last 24 hours) at 03/06/2022 0740 ?Last data filed at 03/06/2022 0500 ?Gross per 24 hour  ?Intake 100 ml  ?Output 800 ml  ?Net -700 ml  ? ?Filed Weights  ? 03/05/22 1800  ?Weight: 74.7 kg  ? ? ?Examination: ? ?General exam: Overall comfortable, not in distress, confused ?HEENT: PERRL ?Respiratory system:  no wheezes or crackles  ?Cardiovascular system: S1 & S2 heard, RRR.  ?Gastrointestinal system: Abdomen is nondistended, soft and nontender. ?Central nervous system: Awake, not alert or oriented ?Extremities: Tenderness on the right hip, right hip immobilizer ?Skin: No rashes, no ulcers,no icterus   ? ? ?Data Reviewed: I have personally reviewed  following labs and imaging studies ? ?CBC: ?Recent Labs  ?Lab 03/05/22 ?0124 03/05/22 ?0800 03/05/22 ?1415  ?WBC 13.2* 11.2* 9.7  ?NEUTROABS 10.6*  --   --   ?HGB 8.9* 9.0* 8.5*  ?HCT 29.7* 28.7* 26.6*  ?MCV 105.3* 102.5* 101.1*  ?PLT 220 229 251  ? ?Basic Metabolic Panel: ?Recent Labs  ?Lab 03/05/22 ?0124  03/05/22 ?0800 03/05/22 ?1415  ?NA 139  --  140  ?K 5.2*  --  6.0*  ?CL 111  --  111  ?CO2 20*  --  23  ?GLUCOSE 107*  --  81  ?BUN 52*  --  49*  ?CREATININE 2.40* 2.32* 2.17*  ?CALCIUM 8.3*  --  8.0*  ? ? ? ?Recent Results (from the past 240 hour(s))  ?MRSA Next Gen by PCR, Nasal     Status: None  ? Collection Time: 03/05/22  7:10 PM  ? Specimen: Nasal Mucosa; Nasal Swab  ?Result Value Ref Range Status  ? MRSA by PCR Next Gen NOT DETECTED NOT DETECTED Final  ?  Comment: (NOTE) ?The GeneXpert MRSA Assay (FDA approved for NASAL specimens only), ?is one component of a comprehensive MRSA colonization surveillance ?program. It is not intended to diagnose MRSA infection nor to guide ?or monitor treatment for MRSA infections. ?Test performance is not FDA approved in patients less than 2 years ?old. ?Performed at [facility], [place] [address], [place], [place] ?[address] ?  ?  ? ?Radiology Studies: ?DG Chest 1 View ? ?Result Date: 03/05/2022 ?CLINICAL DATA:  Fall. EXAM: CHEST  1 VIEW COMPARISON:  Chest x-ray 12/23/2021. FINDINGS: The aorta is ectatic, unchanged. Heart is mildly enlarged. There is no focal lung infiltrate, pleural effusion or pneumothorax. Surgical clips are seen in the upper abdomen. No acute fractures are identified. IMPRESSION: 1. No acute cardiopulmonary process. 2. Stable cardiomegaly. Electronically Signed   By: Ronney Asters M.D.   On: 03/05/2022 01:57  ? ?CT HEAD WO CONTRAST ? ?Result Date: 03/05/2022 ?CLINICAL DATA:  Head trauma. EXAM: CT HEAD WITHOUT CONTRAST TECHNIQUE: Contiguous axial images were obtained from the base of the skull through the vertex without intravenous contrast. RADIATION DOSE REDUCTION: This exam was performed according to the departmental dose-optimization program which includes automated exposure control, adjustment of the mA and/or kV according to patient size and/or use of iterative reconstruction technique. COMPARISON:  Head CT dated 12/23/2021. FINDINGS: Brain: Mild  age-related atrophy and chronic microvascular ischemic changes. There is no acute intracranial hemorrhage. No mass effect or midline shift. No extra-axial fluid collection. Vascular: No hyperdense vessel or unexpected calcification. Skull: Normal. Negative for fracture or focal lesion. Sinuses/Orbits: Mild diffuse mucoperiosteal thickening of paranasal sinuses. No air-fluid level. The mastoid air cells are clear. Other: None IMPRESSION: 1. No acute intracranial pathology. 2. Mild age-related atrophy and chronic microvascular ischemic changes. Electronically Signed   By: Anner Crete M.D.   On: 03/05/2022 03:28  ? ?CT HIP RIGHT WO CONTRAST ? ?Result Date: 03/05/2022 ?CLINICAL DATA:  Fracture hip, unwitnessed fall. EXAM: CT OF THE RIGHT HIP WITHOUT CONTRAST TECHNIQUE: Multidetector CT imaging of the right hip was performed according to the standard protocol. Multiplanar CT image reconstructions were also generated. RADIATION DOSE REDUCTION: This exam was performed according to the departmental dose-optimization program which includes automated exposure control, adjustment of the mA and/or kV according to patient size and/or use of iterative reconstruction technique. COMPARISON:  Radiographs dated January 05, 2022. FINDINGS: Bones/Joint/Cartilage There is comminuted mildly displaced intertrochanteric fracture of the right  femur with superolateral displacement of the distal femur. No pelvic fracture. Sacroiliac joint and pubic symphysis are maintained. The femoral head is located within the acetabular cavity. Ligaments Suboptimally assessed by CT. Muscles and Tendons Intramuscular edema of the gluteus minimus/medius. The remaining muscles and tendons are within normal limits. Soft tissues Enlarged prostate. IMPRESSION: 1. Mildly comminuted intertrochanteric fracture of the right femur with superolateral displacement of the distal femur. 2. Muscle edema of the gluteus medius/minimus. The remaining muscles and tendons  are within normal limits. 3.  Enlarged prostate. Electronically Signed   By: Keane Police D.O.   On: 03/05/2022 09:37  ? ?DG Hip Unilat With Pelvis 2-3 Views Right ? ?Result Date: 03/05/2022 ?CLINICAL DATA:  F

## 2022-03-06 NOTE — Anesthesia Procedure Notes (Signed)
Procedure Name: Intubation ?Date/Time: 03/06/2022 1:50 PM ?Performed by: Minerva Ends, CRNA ?Pre-anesthesia Checklist: Patient identified, Emergency Drugs available, Suction available and Patient being monitored ?Patient Re-evaluated:Patient Re-evaluated prior to induction ?Oxygen Delivery Method: Circle system utilized ?Preoxygenation: Pre-oxygenation with 100% oxygen ?Induction Type: IV induction ?Ventilation: Mask ventilation without difficulty ?Laryngoscope Size: Mac and 3 ?Grade View: Grade II ?Tube type: Oral ?Tube size: 7.0 mm ?Number of attempts: 1 ?Airway Equipment and Method: Stylet and Oral airway ?Placement Confirmation: ETT inserted through vocal cords under direct vision, positive ETCO2 and breath sounds checked- equal and bilateral ?Secured at: 23 cm ?Tube secured with: Tape ?Dental Injury: Teeth and Oropharynx as per pre-operative assessment  ? ? ? ? ?

## 2022-03-06 NOTE — Progress Notes (Signed)
Initial Nutrition Assessment ? ?DOCUMENTATION CODES:  ?Not applicable ? ?INTERVENTION:  ?-Ensure Enlive po BID, each supplement provides 350 kcal and 20 grams of protein. ?-MVI with minerals daily ? ?NUTRITION DIAGNOSIS:  ?Increased nutrient needs related to post-op healing as evidenced by estimated needs. ? ?GOAL:  ?Patient will meet greater than or equal to 90% of their needs ? ?MONITOR:  ?PO intake, Supplement acceptance, Labs, Weight trends ? ?REASON FOR ASSESSMENT:  ?Consult ?Hip fracture protocol ? ?ASSESSMENT:  ?Pt with PMH significant for dementia admitted with right femoral intertrochanteric fracture. ? ?Orthopedics following. ?Pt out of room at time of RD visit -- undergoing ORIF per RN. Noted that pt resides in memory care unit.  ? ?Weight history reviewed. No significant weight changes noted.  ? ?UOP: x24 hours ?I/O: - since admit ? ?Medications reviewed.  ?Labs: ?Recent Labs  ?Lab 03/05/22 ?0124 03/05/22 ?0800 03/05/22 ?1415 03/06/22 ?0626  ?NA 139  --  140 142  ?K 5.2*  --  6.0* 5.3*  ?CL 111  --  111 113*  ?CO2 20*  --  23 23  ?BUN 52*  --  49* 43*  ?CREATININE 2.40* 2.32* 2.17* 2.02*  ?CALCIUM 8.3*  --  8.0* 8.4*  ?GLUCOSE 107*  --  81 73  ?Hgb 8.1 ?  ?NUTRITION - FOCUSED PHYSICAL EXAM: ?Unable to complete at this time as pt is out of room. Will attempt at follow-up.  ? ?Diet Order:   ?Diet Order   ? ?       ?  Diet NPO time specified  Diet effective now       ?  ? ?  ?  ? ?  ? ?EDUCATION NEEDS:  ?Not appropriate for education at this time ? ?Skin:  Skin Assessment: Reviewed RN Assessment ? ?Last BM:  4/17 ? ?Height:  ?Ht Readings from Last 1 Encounters:  ?03/05/22 5' 7.5" (1.715 m)  ? ?Weight:  ?Wt Readings from Last 1 Encounters:  ?03/05/22 74.7 kg  ? ?BMI:  Body mass index is 25.41 kg/m?. ? ?Estimated Nutritional Needs:  ?Kcal:  1900-2100 ?Protein:  95-105 grams ?Fluid:  >1.9L ? ? ? ?Rae Lips., MS, RD, LDN (she/her/hers) ?RD pager number and weekend/on-call pager number located in  Amion. ?

## 2022-03-06 NOTE — TOC CAGE-AID Note (Addendum)
Transition of Care (TOC) - CAGE-AID Screening ? ? ?Patient Details  ?Name: Jesse Cherry ?MRN: [id number] ?Date of Birth: 1933/07/18 ? ?Transition of Care (TOC) CM/SW Contact:    ?Viktoria Gruetzmacher C Tarpley-Carter, LCSWA ?Phone Number: ?03/06/2022, 8:33 AM ? ? ?Clinical Narrative: ?Pt is unable to participate in Cage Aid.  Pt is experiencing dementia.  He also resides in a memory care unit. ? ?Passenger transport manager, MSW, LCSW-A ?Pronouns:  She/Her/Hers ?[facility] of [facility] ?Clinical Social Worker ?Direct Number:  [phone] ?[email or web address] ? ?CAGE-AID Screening: ?Substance Abuse Screening unable to be completed due to: : Patient unable to participate ? ?  ?  ?  ?  ?  ? ?  ? ?  ? ? ? ? ? ? ?

## 2022-03-06 NOTE — Anesthesia Postprocedure Evaluation (Signed)
Anesthesia Post Note ? ?Patient: Jesse Cherry ? ?Procedure(s) Performed: ARTHROPLASTY BIPOLAR HIP (HEMIARTHROPLASTY) (Right: Hip) ? ?  ? ?Patient location during evaluation: PACU ?Anesthesia Type: General ?Level of consciousness: awake ?Pain management: pain level controlled ?Vital Signs Assessment: post-procedure vital signs reviewed and stable ?Respiratory status: spontaneous breathing, nonlabored ventilation, respiratory function stable and patient connected to nasal cannula oxygen ?Cardiovascular status: blood pressure returned to baseline and stable ?Postop Assessment: no apparent nausea or vomiting ?Anesthetic complications: no ? ? ?No notable events documented. ? ?Last Vitals:  ?Vitals:  ? 03/06/22 1749 03/06/22 2036  ?BP: 134/74 116/68  ?Pulse: 63 70  ?Resp: 14 19  ?Temp:  36.6 ?C  ?SpO2: 98% 94%  ?  ?Last Pain:  ?Vitals:  ? 03/06/22 2036  ?TempSrc: Oral  ?PainSc:   ? ? ?  ?  ?  ?  ?  ?  ? ?Nuno Brubacher P Yuliet Needs ? ? ? ? ?

## 2022-03-07 DIAGNOSIS — S72001A Fracture of unspecified part of neck of right femur, initial encounter for closed fracture: Secondary | ICD-10-CM | POA: Diagnosis not present

## 2022-03-07 LAB — TYPE AND SCREEN
ABO/RH(D): O POS
Antibody Screen: NEGATIVE
Unit division: 0

## 2022-03-07 LAB — CBC
HCT: 28.2 % — ABNORMAL LOW (ref 39.0–52.0)
Hemoglobin: 8.9 g/dL — ABNORMAL LOW (ref 13.0–17.0)
MCH: 31.2 pg (ref 26.0–34.0)
MCHC: 31.6 g/dL (ref 30.0–36.0)
MCV: 98.9 fL (ref 80.0–100.0)
Platelets: 226 10*3/uL (ref 150–400)
RBC: 2.85 MIL/uL — ABNORMAL LOW (ref 4.22–5.81)
RDW: 15.8 % — ABNORMAL HIGH (ref 11.5–15.5)
WBC: 10.7 10*3/uL — ABNORMAL HIGH (ref 4.0–10.5)
nRBC: 0 % (ref 0.0–0.2)

## 2022-03-07 LAB — BASIC METABOLIC PANEL
Anion gap: 8 (ref 5–15)
BUN: 41 mg/dL — ABNORMAL HIGH (ref 8–23)
CO2: 19 mmol/L — ABNORMAL LOW (ref 22–32)
Calcium: 7.9 mg/dL — ABNORMAL LOW (ref 8.9–10.3)
Chloride: 112 mmol/L — ABNORMAL HIGH (ref 98–111)
Creatinine, Ser: 1.94 mg/dL — ABNORMAL HIGH (ref 0.61–1.24)
GFR, Estimated: 33 mL/min — ABNORMAL LOW (ref >60)
Glucose, Bld: 148 mg/dL — ABNORMAL HIGH (ref 70–99)
Potassium: 5.1 mmol/L (ref 3.5–5.1)
Sodium: 139 mmol/L (ref 135–145)

## 2022-03-07 LAB — BPAM RBC
Blood Product Expiration Date: 202305262359
ISSUE DATE / TIME: 202304201514
Unit Type and Rh: 5100

## 2022-03-07 MED ORDER — BUSPIRONE HCL 5 MG PO TABS
5.0000 mg | ORAL_TABLET | Freq: Every day | ORAL | Status: DC
Start: 2022-03-07 — End: 2022-03-09
  Administered 2022-03-07 – 2022-03-08 (×2): 5 mg via ORAL
  Filled 2022-03-07 (×2): qty 1

## 2022-03-07 MED ORDER — SENNOSIDES-DOCUSATE SODIUM 8.6-50 MG PO TABS
1.0000 | ORAL_TABLET | Freq: Two times a day (BID) | ORAL | Status: DC
  Administered 2022-03-07 – 2022-03-09 (×5): 1 via ORAL
  Filled 2022-03-07 (×5): qty 1

## 2022-03-07 MED ORDER — DIVALPROEX SODIUM 125 MG PO DR TAB
125.0000 mg | DELAYED_RELEASE_TABLET | Freq: Two times a day (BID) | ORAL | Status: DC
  Administered 2022-03-07 – 2022-03-09 (×5): 125 mg via ORAL
  Filled 2022-03-07 (×6): qty 1

## 2022-03-07 MED ORDER — SERTRALINE HCL 25 MG PO TABS
25.0000 mg | ORAL_TABLET | Freq: Every day | ORAL | Status: DC
Start: 2022-03-07 — End: 2022-03-09
  Administered 2022-03-07 – 2022-03-09 (×3): 25 mg via ORAL
  Filled 2022-03-07 (×3): qty 1

## 2022-03-07 MED ORDER — TRAZODONE HCL 50 MG PO TABS
50.0000 mg | ORAL_TABLET | Freq: Every day | ORAL | Status: DC
Start: 2022-03-07 — End: 2022-03-09
  Administered 2022-03-07 – 2022-03-08 (×2): 50 mg via ORAL
  Filled 2022-03-07 (×2): qty 1

## 2022-03-07 MED ORDER — POLYETHYLENE GLYCOL 3350 17 G PO PACK
17.0000 g | PACK | Freq: Every day | ORAL | Status: DC
  Administered 2022-03-07 – 2022-03-09 (×3): 17 g via ORAL
  Filled 2022-03-07 (×3): qty 1

## 2022-03-07 MED ORDER — MIRTAZAPINE 15 MG PO TABS
15.0000 mg | ORAL_TABLET | Freq: Every day | ORAL | Status: DC
Start: 2022-03-07 — End: 2022-03-09
  Administered 2022-03-07 – 2022-03-08 (×2): 15 mg via ORAL
  Filled 2022-03-07 (×2): qty 1

## 2022-03-07 MED ORDER — HALOPERIDOL 0.5 MG PO TABS: 0.5000 mg | ORAL_TABLET | Freq: Three times a day (TID) | ORAL | Status: DC | PRN

## 2022-03-07 MED ORDER — VITAMIN D 25 MCG (1000 UNIT) PO TABS
1000.0000 [IU] | ORAL_TABLET | Freq: Every day | ORAL | Status: DC
  Administered 2022-03-07 – 2022-03-09 (×3): 1000 [IU] via ORAL
  Filled 2022-03-07 (×3): qty 1

## 2022-03-07 MED ORDER — SODIUM BICARBONATE 650 MG PO TABS
650.0000 mg | ORAL_TABLET | Freq: Two times a day (BID) | ORAL | Status: DC
  Administered 2022-03-07 – 2022-03-09 (×5): 650 mg via ORAL
  Filled 2022-03-07 (×5): qty 1

## 2022-03-07 NOTE — Progress Notes (Signed)
?PROGRESS NOTE ? ?Jesse Cherry  [id number] DOB: [birth year] DOA: 03/05/2022 ?PCP: Jonathon Bellows, PA-C  ? ?Brief Narrative: ?Patient is a 88 year old male with history of advanced dementia who presented from skilled nursing facility memory care after he complained of severe pain on the right hip, found to have right lower extremity rotation after an unwitnessed fall.  On presentation, he was hemodynamically stable.  Right hip x-ray showed acute right femoral intertrochanteric fracture.  CT negative revealed comminuted mildly displaced intertrochanteric fracture of right femur.  CT head without any acute intracranial abnormalities.  Lab work also showed AKI, hyperkalemia.  Orthopedics consulted, underwent bipolar hemiarthroplasty with ORIF on 4/20.  PT/OT evaluation pending. ? ?Assessment & Plan: ? ?Principal Problem: ?  Hip fracture ([place]) ?Active Problems: ?  Closed right hip fracture ([place]) ? ?Fall/right hip fracture: Patient with severe pain of right hip, deformity /decreased range of motion.  Unwitnessed fall at his nursing facility.  Continue pain management, supportive care.  Orthopedics consulted, underwent bipolar hemiarthroplasty with ORIF on 4/20.  PT/OT evaluation pending. DVT prophylaxis with lovenox.  Continue bowel regimen ? ?AKI on CKD stage 3b/hyperkalemia: His baseline creatinine ranges from 1.6-1.7.  Presented with creatinine of 2.4, improved with IV fluids.  Hyperkalemia resolved.  Started on sodium bicarb tablets ? ?Severe dementia: Continue delirium precautions, supportive care, frequent reorientation.  Takes buspirone, Depakote, Haldol, rameron, sertraline, trazodone at rehab center.   ? ?Normocytic  anemia: Baseline hemoglobin in the range of 11, hemoglobin has dropped most likely secondary to acute blood loss at fracture site.  Continue to monitor and transfuse if needed ? ? ?  ?Nutrition Problem: Increased nutrient needs ?Etiology: post-op healing ?  ? ?DVT  prophylaxis:enoxaparin (LOVENOX) injection 30 mg Start: 03/05/22 1000 ? ? ?  Code Status: DNR ? ?Family Communication: Called and discussed with daughter on phone on 4/21 ? ?Patient status:Inpatient ? ?Patient is from :SNF/memory care ? ?Anticipated discharge to:SNF ? ?Estimated DC date: 1 to 2 days ? ? ?Consultants: Orthopedics ? ?Procedures:ORIF ? ?Antimicrobials:  ?Anti-infectives (From admission, onward)  ? ? Start     Dose/Rate Route Frequency Ordered Stop  ? 03/07/22 0600  ceFAZolin (ANCEF) IVPB 2g/100 mL premix       ? 2 g ?200 mL/hr over 30 Minutes Intravenous On call to O.R. 03/06/22 1234 03/06/22 1413  ? 03/07/22 0100  ceFAZolin (ANCEF) IVPB 1 g/50 mL premix  Status:  Discontinued       ? 1 g ?100 mL/hr over 30 Minutes Intravenous Every 8 hours 03/06/22 1752 03/06/22 1801  ? 03/07/22 0100  ceFAZolin (ANCEF) IVPB 1 g/50 mL premix       ? 1 g ?100 mL/hr over 30 Minutes Intravenous Every 8 hours 03/06/22 1801 03/07/22 0230  ? 03/06/22 1237  ceFAZolin (ANCEF) 2-4 GM/100ML-% IVPB       ?Note to Pharmacy: Cameron Sprang M: cabinet override  ?    03/06/22 1237 03/06/22 1414  ? ?  ? ? ?Subjective: ?Patient seen and examined the bedside this morning. ?Looks comfortable.  Denies any complaints today, hard of hearing, confused ? ?Objective: ?Vitals:  ? 03/06/22 1749 03/06/22 2036 03/07/22 0658 03/07/22 0700  ?BP: 134/74 116/68 99/68 (!) 106/58  ?Pulse: 63 70 61 64  ?Resp: 14 19 12 13   ?Temp:  97.9 ?F (36.6 ?C) 98.2 ?F (36.8 ?C)   ?TempSrc:  Oral Oral   ?SpO2: 98% 94% 96%   ?Weight:      ?Height:      ? ? ?  Intake/Output Summary (Last 24 hours) at 03/07/2022 0944 ?Last data filed at 03/07/2022 0705 ?Gross per 24 hour  ?Intake 1715 ml  ?Output 1210 ml  ?Net 505 ml  ? ?Filed Weights  ? 03/05/22 1800  ?Weight: 74.7 kg  ? ? ?Examination: ? ?General exam: Deconditioned elderly patient, confused, hard of hearing ?HEENT: PERRL ?Respiratory system:  no wheezes or crackles  ?Cardiovascular system: S1 & S2 heard, RRR.   ?Gastrointestinal system: Abdomen is nondistended, soft and nontender. ?Central nervous system: Alert and awake but not oriented ?Extremities: Tenderness on the right hip, clean surgical wound ?Skin: No rashes, no ulcers,no icterus   ? ?Data Reviewed: I have personally reviewed following labs and imaging studies ? ?CBC: ?Recent Labs  ?Lab 03/05/22 ?0124 03/05/22 ?0800 03/05/22 ?1415 03/06/22 ?[id number] 03/07/22 ?0153  ?WBC 13.2* 11.2* 9.7 8.3 10.7*  ?NEUTROABS 10.6*  --   --   --   --   ?HGB 8.9* 9.0* 8.5* 8.1* 8.9*  ?HCT 29.7* 28.7* 26.6* 26.2* 28.2*  ?MCV 105.3* 102.5* 101.1* 101.6* 98.9  ?PLT 220 229 251 216 226  ? ?Basic Metabolic Panel: ?Recent Labs  ?Lab 03/05/22 ?0124 03/05/22 ?0800 03/05/22 ?1415 03/06/22 ?[id number] 03/06/22 ?1806 03/07/22 ?0153  ?NA 139  --  140 142 140 139  ?K 5.2*  --  6.0* 5.3* 5.3* 5.1  ?CL 111  --  111 113* 114* 112*  ?CO2 20*  --  23 23 17* 19*  ?GLUCOSE 107*  --  81 73 115* 148*  ?BUN 52*  --  49* 43* 40* 41*  ?CREATININE 2.40* 2.32* 2.17* 2.02* 1.88* 1.94*  ?CALCIUM 8.3*  --  8.0* 8.4* 8.0* 7.9*  ? ? ? ?Recent Results (from the past 240 hour(s))  ?MRSA Next Gen by PCR, Nasal     Status: None  ? Collection Time: 03/05/22  7:10 PM  ? Specimen: Nasal Mucosa; Nasal Swab  ?Result Value Ref Range Status  ? MRSA by PCR Next Gen NOT DETECTED NOT DETECTED Final  ?  Comment: (NOTE) ?The GeneXpert MRSA Assay (FDA approved for NASAL specimens only), ?is one component of a comprehensive MRSA colonization surveillance ?program. It is not intended to diagnose MRSA infection nor to guide ?or monitor treatment for MRSA infections. ?Test performance is not FDA approved in patients less than 2 years ?old. ?Performed at [facility], [place] [address], [place], [place] ?[address] ?  ?  ? ?Radiology Studies: ?DG HIP UNILAT WITH PELVIS 1V RIGHT ? ?Result Date: 03/06/2022 ?CLINICAL DATA:  Post hemiarthroplasty of the RIGHT hip. EXAM: DG HIP (WITH OR WITHOUT PELVIS) 1V RIGHT COMPARISON:  March 05, 2022. FINDINGS:  Post RIGHT hip arthroplasty, single view in AP/oblique projection with changes of RIGHT hip arthroplasty. Acetabular component projects over acetabulum. Cerclage wires surround the proximal aspect of the femoral component about the intratrochanteric and subtrochanteric level as well as along the margin of the greater trochanter. There may be some fragmentation of the greater trochanter but no distraction noted in this area. Gas is present in the soft tissues. IMPRESSION: 1. Post surgical changes of RIGHT hip arthroplasty. 2. Post cerclage fixation about the femoral component with some fragmentation of the medial aspect of the greater trochanter without distraction. Attention on follow-up. Electronically Signed   By: Zetta Bills M.D.   On: 03/06/2022 16:09  ? ?DG HIP UNILAT W OR W/O PELVIS 2-3 VIEWS RIGHT ? ?Result Date: 03/06/2022 ?CLINICAL DATA:  Right hip arthroplasty EXAM: DG HIP (WITH OR WITHOUT PELVIS) 2-3V  RIGHT COMPARISON:  03/05/2022, 03/06/2022 FINDINGS: Two frontal views of the pelvis and right hip as well as a frogleg lateral view of the right hip are obtained. Right hip arthroplasty is identified. Multiple cerclage wires are seen surrounding the proximal femur and greater trochanter, with stable fragmentation of the greater trochanter as seen previously. Alignment is anatomic. Extensive postsurgical changes in the overlying soft tissues. Stable ORIF of a prior left hip fracture. IMPRESSION: 1. Status post right hip arthroplasty. 2. Multiple cerclage wires surround the proximal right hip and arthroplasty components. Stable fragmentation of the greater trochanter. 3. Chronic ORIF of the left hip. Electronically Signed   By: Randa Ngo M.D.   On: 03/06/2022 19:54   ? ?Scheduled Meds: ? enoxaparin (LOVENOX) injection  30 mg Subcutaneous Q24H  ? feeding supplement  237 mL Oral BID BM  ? multivitamin with minerals  1 tablet Oral Daily  ? sodium bicarbonate  650 mg Oral BID  ? ?Continuous Infusions: ?  sodium chloride    ? ? ? LOS: 2 days  ? ?Shelly Coss, MD ?[facility] ?P4/21/2023, 9:44 AM   ?

## 2022-03-07 NOTE — Progress Notes (Signed)
? ? ? ?  Subjective: ? ?Patient denies having any pain. Family feels patient has seemed comfortable post op.  Has not yet worked with PT. Family would like to get patient bck to his memory care unit from the hospital. No concerns. ? ?Objective:  ? ?VITALS:   ?Vitals:  ? 03/06/22 1749 03/06/22 2036 03/07/22 0658 03/07/22 0700  ?BP: 134/74 116/68 99/68 (!) 106/58  ?Pulse: 63 70 61 64  ?Resp: 14 19 12 13   ?Temp:  97.9 ?F (36.6 ?C) 98.2 ?F (36.8 ?C)   ?TempSrc:  Oral Oral   ?SpO2: 98% 94% 96%   ?Weight:      ?Height:      ? ? ?Intact pulses distally ?Dorsiflexion/Plantar flexion intact ?Incision: dressing C/D/I ?No cellulitis present ?Compartment soft ? ? ?Lab Results  ?Component Value Date  ? WBC 10.7 (H) 03/07/2022  ? HGB 8.9 (L) 03/07/2022  ? HCT 28.2 (L) 03/07/2022  ? MCV 98.9 03/07/2022  ? PLT 226 03/07/2022  ? ?BMET ?   ?Component Value Date/Time  ? NA 139 03/07/2022 0153  ? K 5.1 03/07/2022 0153  ? CL 112 (H) 03/07/2022 0153  ? CO2 19 (L) 03/07/2022 0153  ? GLUCOSE 148 (H) 03/07/2022 0153  ? BUN 41 (H) 03/07/2022 0153  ? CREATININE 1.94 (H) 03/07/2022 0153  ? CALCIUM 7.9 (L) 03/07/2022 0153  ? GFRNONAA 33 (L) 03/07/2022 0153  ? ? ? ? ?Xray: post op xrays show good alignment of R hip hemiarthroplasty and cerclage cable fixation, no adverse features. ? ?Assessment/Plan: ?1 Day Post-Op  ? ?Principal Problem: ?  Hip fracture ([facility]) ?Active Problems: ?  Closed right hip fracture ([facility]) ? ?R hip hemiarthroplasty 4/20 ? ?Post op recs: ?WB: WBAT ?Abx: ancef x23 hours post op ?Imaging: PACU xrays ?Dressing: Aquacel dressing to be kept intact until follow-up ?DVT prophylaxis: lovenox starting POD1 x4 weeks ?Follow up: 2 weeks after surgery for a wound check with Dr. 5/20 at [facility].  ?Address: [address], [place], [place]  ?Office Phone: [phone] ? ? ? ?Jordanny Waddington A Ame Heagle ?03/07/2022, 11:20 AM ? ? ?03/09/2022, MD ? ?Contact information:   ?Weekdays 7am-5pm epic message  Dr. 07-21-2006, or call office for patient follow up: [phone] ?After hours and holidays please check [email or web address] for group call information for [facility] ? ?  ?

## 2022-03-07 NOTE — Evaluation (Signed)
Physical Therapy Evaluation ?Patient Details ?Name: Jesse Cherry ?MRN: [id number] ?DOB: [birth year] ?Today's Date: 03/07/2022 ? ?History of Present Illness ? Pt is an 88 y.o. male admitted from [facility] on 03/07/22 after fall sustaining R intertrochanteric femur and femoral neck fx. S/p R bipolar hemiarthroplasty with ORIF on 4/20. PMH includes advanced dementia, CKD, falls. ?  ?Clinical Impression ? Pt presents with generalized weakness, decreased activity tolerance, impaired balance strategies, and an overall decrease in functional mobility secondary to above. Pt with advanced dementia, resident at [facility] memory care unit. Per daughters, PTA, pt ambulatory without DME at supervision-level, staff assists with ADL/iADLs. Today, pt requiring up to minA for short bout of ambulation with RW, mobility limited by c/o R hip pain. Educ daughters re: precautions, positioning, DVT prevention, activity recommendations. Pt would benefit from return to familiar environment at [facility]F with HHPT services if staff able to provide necessary assist. ?   ?Post-transfer BP 122/63 ?HR 69-80s ?SpO2 92% on RA   ? ?Recommendations for follow up therapy are one component of a multi-disciplinary discharge planning process, led by the attending physician.  Recommendations may be updated based on patient status, additional functional criteria and insurance authorization. ? ?Follow Up Recommendations Home health PT (at ALF/Memory Care) ? ?  ?Assistance Recommended at Discharge Frequent or constant Supervision/Assistance  ?Patient can return home with the following ? A little help with walking and/or transfers;A lot of help with bathing/dressing/bathroom ? ?  ?Equipment Recommendations Rolling walker (2 wheels) (TBD w/c)  ?Recommendations for Other Services ?    ?  ?Functional Status Assessment Patient has had a recent decline in their functional status and demonstrates the ability to make significant improvements  in function in a reasonable and predictable amount of time.  ? ?  ?Precautions / Restrictions Precautions ?Precautions: Fall;Other (comment) ?Precaution Comments: urine incontinence ?Restrictions ?Weight Bearing Restrictions: Yes ?RLE Weight Bearing: Weight bearing as tolerated  ? ?  ? ?Mobility ? Bed Mobility ?Overal bed mobility: Needs Assistance ?Bed Mobility: Supine to Sit ?  ?  ?Supine to sit: Min assist, HOB elevated ?  ?  ?General bed mobility comments: pt initiating movement to EOB well, minA for trunk elevation ?  ? ?Transfers ?Overall transfer level: Needs assistance ?Equipment used: Rolling walker (2 wheels) ?Transfers: Sit to/from Stand ?Sit to Stand: Min assist, +2 safety/equipment ?  ?  ?  ?  ?  ?General transfer comment: some impulsivity requiring max cues to remain sitting until lines untangled; minA for trunk elevation and stabilizing RW with standing, pt repeatedly c/o R hip pain, following gestural cues to stand ?  ? ?Ambulation/Gait ?Ambulation/Gait assistance: Min assist, +2 safety/equipment ?Gait Distance (Feet): 6 Feet ?  ?  ?Gait velocity: Decreased ?  ?  ?General Gait Details: slow, unsteady gait ~6' from bed to recliner with RW and minA for stability, noted RLE instability, pt repeatedly endorses pain; multimodal cues to not sit prematurely ? ?Stairs ?  ?  ?  ?  ?  ? ?Wheelchair Mobility ?  ? ?Modified Rankin (Stroke Patients Only) ?  ? ?  ? ?Balance Overall balance assessment: Needs assistance ?Sitting-balance support: No upper extremity supported, Feet supported ?Sitting balance-Leahy Scale: Fair ?  ?  ?Standing balance support: Reliant on assistive device for balance ?Standing balance-Leahy Scale: Poor ?Standing balance comment: reliant on BUE support and external assist ?  ?  ?  ?  ?  ?  ?  ?  ?  ?  ?  ?   ? ? ? ?  Pertinent Vitals/Pain Pain Assessment ?Pain Assessment: Faces ?Faces Pain Scale: Hurts even more ?Pain Location: R hip ?Pain Descriptors / Indicators: Discomfort, Grimacing,  Guarding, Moaning ?Pain Intervention(s): Monitored during session, Repositioned, Patient requesting pain meds-RN notified  ? ? ?Home Living Family/patient expects to be discharged to:: Assisted living ?  ?  ?  ?  ?  ?  ?  ?  ?  ?Additional Comments: pt resident at [facility] Memory Care  ?  ?Prior Function Prior Level of Function : Needs assist;History of Falls (last six months) ? Cognitive Assist : Mobility (cognitive);ADLs (cognitive) ?  ?  ?Physical Assist : ADLs (physical) ?  ?  ?Mobility Comments: typically ambulatory without DME or assist, supervision from staff due to dementia ?ADLs Comments: Staff assists with all ADL/iADLs as needed ?  ? ? ?Hand Dominance  ?   ? ?  ?Extremity/Trunk Assessment  ? Upper Extremity Assessment ?Upper Extremity Assessment: Generalized weakness ?  ? ?Lower Extremity Assessment ?Lower Extremity Assessment: Generalized weakness;RLE deficits/detail ?RLE Deficits / Details: s/p R hip hemiarthoplasty w/ ORIF; gross functional strength >/ 3/5 although painful ?RLE Coordination: decreased gross motor ?  ? ?Cervical / Trunk Assessment ?Cervical / Trunk Assessment: Kyphotic  ?Communication  ? Communication: HOH (extremely HOH (R>L))  ?Cognition Arousal/Alertness: Awake/alert ?Behavior During Therapy: [facility] for tasks assessed/performed, Anxious ?Overall Cognitive Status: History of cognitive impairments - at baseline ?  ?  ?  ?  ?  ?  ?  ?  ?  ?  ?  ?  ?  ?  ?  ?  ?General Comments: h/o advanced dementia, but agreeable and willing to do what is asked with increased cues ?  ?  ? ?  ?General Comments General comments (skin integrity, edema, etc.): Pt's daughters present and supportive; educ daughters on precautions, positioning, DVT prevention, importance of frequent BLE AROM ? ?  ?Exercises    ? ?Assessment/Plan  ?  ?PT Assessment Patient needs continued PT services  ?PT Problem List Decreased strength;Decreased range of motion;Decreased activity tolerance;Decreased balance;Decreased  mobility;Decreased knowledge of use of DME;Decreased cognition;Decreased safety awareness;Decreased knowledge of precautions;Pain ? ?   ?  ?PT Treatment Interventions DME instruction;Gait training;Functional mobility training;Therapeutic activities;Therapeutic exercise;Balance training;Patient/family education   ? ?PT Goals (Current goals can be found in the Care Plan section)  ?Acute Rehab PT Goals ?Patient Stated Goal: Hopeful for return to [facility]F with PT services ?PT Goal Formulation: With family ?Time For Goal Achievement: 03/21/22 ?Potential to Achieve Goals: Good ? ?  ?Frequency Min 5X/week ?  ? ? ?Co-evaluation   ?  ?  ?  ?  ? ? ?  ?AM-PAC PT "6 Clicks" Mobility  ?Outcome Measure Help needed turning from your back to your side while in a flat bed without using bedrails?: A Little ?Help needed moving from lying on your back to sitting on the side of a flat bed without using bedrails?: A Little ?Help needed moving to and from a bed to a chair (including a wheelchair)?: A Little ?Help needed standing up from a chair using your arms (e.g., wheelchair or bedside chair)?: A Little ?Help needed to walk in hospital room?: Total ?Help needed climbing 3-5 steps with a railing? : Total ?6 Click Score: 14 ? ?  ?End of Session Equipment Utilized During Treatment: Gait belt ?Activity Tolerance: Patient tolerated treatment well;Patient limited by pain ?Patient left: in chair;with call bell/phone within reach;with chair alarm set ?Nurse Communication: Mobility status ?PT Visit Diagnosis: Other  abnormalities of gait and mobility (R26.89);Pain ?Pain - Right/Left: Right ?Pain - part of body: Hip ?  ? ?Time: [id number] ?PT Time Calculation (min) (ACUTE ONLY): 24 min ? ? ?Charges:   PT Evaluation ?$PT Eval Moderate Complexity: 1 Mod ?  ?  ?   ?Ina HomesJaclyn Emireth Cockerham, PT, DPT ?Acute Rehabilitation Services  ?Pager [phone] ?Office [phone] ? ?Malachy ChamberJaclyn L Omolola Mittman ?03/07/2022, 5:05 PM ? ?

## 2022-03-07 NOTE — Progress Notes (Signed)
Mobility Specialist: Progress Note ? ? 03/07/22 1730  ?Mobility  ?Activity Transferred from chair to bed  ?Level of Assistance Moderate assist, patient does 50-74%  ?Assistive Device Front wheel walker  ?RLE Weight Bearing WBAT  ?Distance Ambulated (ft) 2 ft  ?Activity Response Tolerated well  ?$Mobility charge 1 Mobility  ? ?Pt assisted back to bed per request. ModA to stand and minA during transfer d/t R knee buckling. Pt back in bed with RN present in the room.  ? ?Jesse Cherry Jesse Cherry ?Mobility Specialist ?Mobility Specialist [place]: [phone] ?Mobility Specialist [place]: [phone] ? ?

## 2022-03-07 NOTE — Progress Notes (Addendum)
[facility]) Hospital Liaison: RN note    ?  ?This is a current [facility] hospice patient from [facility] memory care with a terminal diagnosis of protein calorie malnutrition. He was transported to ED for evaluation following an unwitnessed fall. Hospice was not notified. He  was admitted to the hospital on 4/19 with a diagnosis of a fractured right femur. Per Dr. Gildardo Cranker this is a related hospital admission.  ? ?Appropriate for in patient care due to need for symptom management following surgery.  ? ?Sitting back in bed, no apparent distress. Very HOH. Oriented to baseline. Daughters at bedside.  ? ?VS: 98.2, 62,99/68, 12, 96% RA ?I/O: 1715/660 ? ?Abnormal Labs: ?03/07/22 01:53 ?Chloride: 112 (H) ?CO2: 19 (L) ?Glucose: 148 (H) ?BUN: 41 (H) ?Creatinine: 1.94 (H) ?Calcium: 7.9 (L) ?GFR, Estimated: 33 (L) ?WBC: 10.7 (H) ?RBC: 2.85 (L) ?Hemoglobin: 8.9 (L) ?HCT: 28.2 (L) ?RDW: 15.8 (H) ? ?IV/PRN medications: Oxycodone 2.5mg -5mg  PRN PO @ 0812, 1329 ? ?Problem list: ?Fall/right hip fracture: Patient with severe pain of right hip, deformity /decreased range of motion.  Unwitnessed fall at his nursing facility.  Continue pain management, supportive care.  Orthopedics consulted, underwent bipolar hemiarthroplasty with ORIF on 4/20.  PT/OT evaluation pending. DVT prophylaxis with lovenox.  Continue bowel regimen ?  ?AKI on CKD stage 3b/hyperkalemia: His baseline creatinine ranges from 1.6-1.7.  Presented with creatinine of 2.4, improved with IV fluids.  Hyperkalemia resolved.  Started on sodium bicarb tablets ?  ?Severe dementia: Continue delirium precautions, supportive care, frequent reorientation.  Takes buspirone, Depakote, Haldol, rameron, sertraline, trazodone at rehab center.   ?  ?Normocytic  anemia: Baseline hemoglobin in the range of 11, hemoglobin has dropped most likely secondary to acute blood loss at fracture site.  Continue to monitor and transfuse if needed ?  ?Nutrition Problem:  Increased nutrient needs ?Etiology: post-op healing ? ?Discharge planning: ongoing pending PT evaluation ?Family contact: Spoke with daughters in room ?IDG: updated. ?GOC: DNR  ?  ?Should patient need ambulance transport at discharge please use [facility] as [facility] contracts this service with them for our active hospice patients. ?  ?A Please do not hesitate to call with questions.    ?  ?Thank you,    ?  ?Farrel Gordon, RN, CCM       ?[facility] Hospital Liaison    ?[phone]  ?  ?

## 2022-03-07 NOTE — TOC Initial Note (Signed)
Transition of Care (TOC) - Initial/Assessment Note  ? ? ?Patient Details  ?Name: Jesse Cherry ?MRN: [id number] ?Date of Birth: 05/07/1933 ? ?Transition of Care (TOC) CM/SW Contact:    ?Deatra Robinson, LCSW ?Phone Number: ?03/07/2022, 9:48 AM ? ?Clinical Narrative:  SW spoke with pt's dtr Arline Asp [phone] who confirmed pt admitted from [facility] ALF/Memory Care and is active with [facility]) for hospice services. Pt's dtr prefers for pt to return to [facility] with continued hospice services if [facility] is able to meet pt's current needs. PT/OT pending. Discussed the possibility of SNF recommendation and dtr is aware pt would need to revoke hospice in order to use SNF benefits under Medicare.  ? ?Voicemail left for Riki Rusk with [facility] requesting return call to discuss above. [facility] is aware of admission and is following. SW will assist as indicated.  ? ?Dellie Burns, MSW, LCSW ?[phone] (coverage) ? ? ? ? ?Expected Discharge Plan: Assisted Living ?Barriers to Discharge: Continued Medical Work up ? ? ?Patient Goals and CMS Choice ?  ?  ?  ? ?Expected Discharge Plan and Services ?Expected Discharge Plan: Assisted Living ?  ?  ?  ?Living arrangements for the past 2 months: Assisted Living Facility (Memory Care) ?                ?  ?  ?  ?  ?  ?  ?HH Agency: [facility] ?  ?  ?Representative spoke with at [facility] Agency: [facility] ? ?Prior Living Arrangements/Services ?Living arrangements for the past 2 months: Assisted Living Facility (Memory Care) ?Lives with:: Facility Resident ?Patient language and need for interpreter reviewed:: No ?       ?Need for Family Participation in Patient Care: Yes (Comment) ?Care giver support system in place?: Yes (comment) ?Current home services: Hospice ?Criminal Activity/Legal Involvement Pertinent to Current Situation/Hospitalization: No - Comment as needed ? ?Activities of Daily Living ?Home  Assistive Devices/Equipment: None ?ADL Screening (condition at time of admission) ?Patient's cognitive ability adequate to safely complete daily activities?: No ?Is the patient deaf or have difficulty hearing?: Yes ?Does the patient have difficulty seeing, even when wearing glasses/contacts?: No ?Does the patient have difficulty concentrating, remembering, or making decisions?: Yes ?Patient able to express need for assistance with ADLs?: Yes ?Does the patient have difficulty dressing or bathing?: Yes ?Independently performs ADLs?: No ?Communication: Independent ?Dressing (OT): Needs assistance ?Is this a change from baseline?: Change from baseline, expected to last >3 days ?Grooming: Needs assistance ?Is this a change from baseline?: Change from baseline, expected to last >3 days ?Feeding: Independent ?Bathing: Dependent ?Is this a change from baseline?: Change from baseline, expected to last >3 days ?Toileting: Dependent ?Is this a change from baseline?: Change from baseline, expected to last >3days ?In/Out Bed: Dependent ?Is this a change from baseline?: Change from baseline, expected to last >3 days ?Walks in Home: Dependent ?Is this a change from baseline?: Change from baseline, expected to last >3 days ?Does the patient have difficulty walking or climbing stairs?: Yes ?Weakness of Legs: Right ?Weakness of Arms/Hands: None ? ?Permission Sought/Granted ?Permission sought to share information with : Magazine features editor ?Permission granted to share information with : Yes, Verbal Permission Granted ?   ? Permission granted to share info w AGENCY: [facility]; [facility] ?   ?   ? ?Emotional Assessment ?  ?  ?  ?Orientation: : Fluctuating Orientation (Suspected and/or reported Sundowners) ?  ?Psych  Involvement: No (comment) ? ?Admission diagnosis:  Hip fracture (HCC) [S72.009A] ?Acute kidney injury (nontraumatic) (HCC) [N17.9] ?Closed right hip fracture (HCC) [S72.001A] ?Macrocytic anemia  [D53.9] ?Closed intertrochanteric fracture of right hip, initial encounter (HCC) [S72.141A] ?Patient Active Problem List  ? Diagnosis Date Noted  ? Hip fracture (HCC) 03/05/2022  ? Closed right hip fracture (HCC) 03/05/2022  ? ?PCP:  Bailey Mech, PA-C ?Pharmacy:   ?[id number] - [place], [place] - [address] ?[address] ?[place] [address] ?Phone: [phone] Fax: [phone] ? ?[id number] - [place], [place] - [address] AT [facility] OF [address] ?[address] ?[place] [address] ?Phone: [phone] Fax: [phone] ? ? ? ? ?Social Determinants of Health (SDOH) Interventions ?  ? ?Readmission Risk Interventions ?   ? View : No data to display.  ?  ?  ?  ? ? ? ?

## 2022-03-08 DIAGNOSIS — S72001A Fracture of unspecified part of neck of right femur, initial encounter for closed fracture: Secondary | ICD-10-CM | POA: Diagnosis not present

## 2022-03-08 LAB — BASIC METABOLIC PANEL
Anion gap: 6 (ref 5–15)
BUN: 47 mg/dL — ABNORMAL HIGH (ref 8–23)
CO2: 20 mmol/L — ABNORMAL LOW (ref 22–32)
Calcium: 7.9 mg/dL — ABNORMAL LOW (ref 8.9–10.3)
Chloride: 112 mmol/L — ABNORMAL HIGH (ref 98–111)
Creatinine, Ser: 1.9 mg/dL — ABNORMAL HIGH (ref 0.61–1.24)
GFR, Estimated: 34 mL/min — ABNORMAL LOW (ref >60)
Glucose, Bld: 93 mg/dL (ref 70–99)
Potassium: 4.5 mmol/L (ref 3.5–5.1)
Sodium: 138 mmol/L (ref 135–145)

## 2022-03-08 LAB — CBC
HCT: 24 % — ABNORMAL LOW (ref 39.0–52.0)
Hemoglobin: 7.6 g/dL — ABNORMAL LOW (ref 13.0–17.0)
MCH: 30.8 pg (ref 26.0–34.0)
MCHC: 31.7 g/dL (ref 30.0–36.0)
MCV: 97.2 fL (ref 80.0–100.0)
Platelets: 235 10*3/uL (ref 150–400)
RBC: 2.47 MIL/uL — ABNORMAL LOW (ref 4.22–5.81)
RDW: 15.6 % — ABNORMAL HIGH (ref 11.5–15.5)
WBC: 9.9 10*3/uL (ref 4.0–10.5)
nRBC: 0 % (ref 0.0–0.2)

## 2022-03-08 NOTE — NC FL2 (Signed)
?[place] MEDICAID FL2 LEVEL OF CARE SCREENING TOOL  ?  ? ?IDENTIFICATION  ?Patient Name: ?Jesse Cherry Birthdate: 1932-12-28 Sex: male Admission Date (Current Location): ?03/05/2022  ?[place] Number: ? [place] ?  Facility and Address:  ?[facility], [address], [place], [place] [address] ?     Provider Number: ?[id number]  ?Attending Physician Name and Address:  ?Burnadette Pop, MD ? Relative Name and Phone Number:  ?[name],[name] Daughter [phone] ?   ?Current Level of Care: ?Hospital Recommended Level of Care: ?Assisted Living Facility, Memory Care Prior Approval Number: ?  ? ?Date Approved/Denied: ?  PASRR Number: ?[id number] A ? ?Discharge Plan: ?Other (Comment) (ALF/ Memory Care) ?  ? ?Current Diagnoses: ?Patient Active Problem List  ? Diagnosis Date Noted  ? Hip fracture (HCC) 03/05/2022  ? Closed right hip fracture (HCC) 03/05/2022  ? ? ?Orientation RESPIRATION BLADDER Height & Weight   ?  ?Self ? Normal Continent Weight: 164 lb 10.9 oz (74.7 kg) ?Height:  5' 7.5" (171.5 cm)  ?BEHAVIORAL SYMPTOMS/MOOD NEUROLOGICAL BOWEL NUTRITION STATUS  ?    Continent Diet (see discharge summary)  ?AMBULATORY STATUS COMMUNICATION OF NEEDS Skin   ?Supervision Verbally Other (Comment) (right hip incision with silver dressing) ?  ?  ?  ?    ?     ?     ? ? ?Personal Care Assistance Level of Assistance  ?Bathing, Dressing Bathing Assistance: Limited assistance ?  ?Dressing Assistance: Limited assistance ?   ? ?Functional Limitations Info  ?Hearing   ?Hearing Info: Impaired ?   ? ? ?SPECIAL CARE FACTORS FREQUENCY  ?PT (By licensed PT), OT (By licensed OT)   ?  ?PT Frequency: per facility ?OT Frequency: per facility ?  ?  ?  ?   ? ? ?Contractures    ? ? ?Additional Factors Info  ?Code Status Code Status Info: DNR ?  ?  ?  ?  ?   ? ?Current Medications (03/08/2022):  This is the current hospital active medication list ?Current Facility-Administered Medications  ?Medication Dose Route  Frequency Provider Last Rate Last Admin  ? 0.9 %  sodium chloride infusion  10 mL/hr Intravenous Once Joen Laura, MD      ? acetaminophen (TYLENOL) tablet 650 mg  650 mg Oral Q6H PRN Joen Laura, MD      ? busPIRone (BUSPAR) tablet 5 mg  5 mg Oral QHS Burnadette Pop, MD   5 mg at 03/07/22 2116  ? cholecalciferol (VITAMIN D3) tablet 1,000 Units  1,000 Units Oral Daily Burnadette Pop, MD   1,000 Units at 03/08/22 1010  ? divalproex (DEPAKOTE) DR tablet 125 mg  125 mg Oral BID Burnadette Pop, MD   125 mg at 03/08/22 1010  ? enoxaparin (LOVENOX) injection 30 mg  30 mg Subcutaneous Q24H Joen Laura, MD   30 mg at 03/08/22 1009  ? feeding supplement (ENSURE ENLIVE / ENSURE PLUS) liquid 237 mL  237 mL Oral BID BM Joen Laura, MD   237 mL at 03/07/22 1427  ? haloperidol (HALDOL) tablet 0.5 mg  0.5 mg Oral Q8H PRN Burnadette Pop, MD      ? HYDROcodone-acetaminophen (NORCO/VICODIN) 5-325 MG per tablet 1-2 tablet  1-2 tablet Oral Q6H PRN Joen Laura, MD      ? melatonin tablet 3 mg  3 mg Oral QHS PRN Joen Laura, MD   3 mg at 03/06/22 2021  ? mirtazapine (REMERON) tablet 15 mg  15 mg Oral QHS Burnadette Pop, MD   15 mg at 03/07/22 2116  ? morphine (PF) 2 MG/ML injection 0.5 mg  0.5 mg Intravenous Q2H PRN Joen Laura, MD   0.5 mg at 03/06/22 1823  ? multivitamin with minerals tablet 1 tablet  1 tablet Oral Daily Joen Laura, MD   1 tablet at 03/08/22 1010  ? oxyCODONE (Oxy IR/ROXICODONE) immediate release tablet 2.5-5 mg  2.5-5 mg Oral Q4H PRN Joen Laura, MD   5 mg at 03/07/22 2143  ? polyethylene glycol (MIRALAX / GLYCOLAX) packet 17 g  17 g Oral Daily Burnadette Pop, MD   17 g at 03/08/22 1011  ? senna-docusate (Senokot-S) tablet 1 tablet  1 tablet Oral BID Burnadette Pop, MD   1 tablet at 03/08/22 1010  ? sertraline (ZOLOFT) tablet 25 mg  25 mg Oral Daily Burnadette Pop, MD   25 mg at 03/08/22 1010  ? sodium bicarbonate tablet 650 mg   650 mg Oral BID Burnadette Pop, MD   650 mg at 03/08/22 1010  ? traZODone (DESYREL) tablet 50 mg  50 mg Oral QHS Burnadette Pop, MD   50 mg at 03/07/22 2116  ? ? ? ?Discharge Medications: ?Please see discharge summary for a list of discharge medications. ? ?Relevant Imaging Results: ? ?Relevant Lab Results: ? ? ?Additional Information ?[facility]osp[facility] Following ? ?Davit Vassar M Andreya Lacks, LCSWA ? ? ? ? ?

## 2022-03-08 NOTE — Progress Notes (Signed)
PT Cancellation Note ? ?Patient Details ?Name: Jesse Cherry ?MRN: [id number] ?DOB: [birth year] ? ? ?Cancelled Treatment:    Reason Eval/Treat Not Completed: Fatigue/lethargy limiting ability to participate, pt sleeping with max c/o fatigue, daughters present and answered all questions re; disposition destination and PT recommendations for d/c. Will check back as schedule allows to continue with PT POC. ? ? ? ?Marlana Salvage Hearl Heikes ?03/08/2022, 3:04 PM ?

## 2022-03-08 NOTE — TOC Progression Note (Signed)
Transition of Care (TOC) - Progression Note  ? ? ?Patient Details  ?Name: Jesse Cherry ?MRN: [id number] ?Date of Birth: Apr 11, 1933 ? ?Transition of Care (TOC) CM/SW Contact  ?Nahom Carfagno M Aleen Marston, LCSWA ?Phone Number: ?03/08/2022, 11:52 AM ? ?Clinical Narrative:    ? ?Pt cleared to return to [facility] with [facility] if amendable.  ? ?SW left VM with [facility] [phone]) to determine if able to accept back tomorrow 4/23 ? ?Per [facility] liaison note, request [facility] transport at d/c.  ? ?Expected Discharge Plan: Assisted Living ?Barriers to Discharge: Continued Medical Work up ? ?Expected Discharge Plan and Services ?Expected Discharge Plan: Assisted Living ?  ?  ?  ?Living arrangements for the past 2 months: Assisted Living Facility (Memory Care) ?                ?  ?  ?  ?  ?  ?  ?HH Agency: [facility] ?  ?  ?Representative spoke with at [facility] Agency: [facility] ? ? ?Social Determinants of Health (SDOH) Interventions ?  ? ?Readmission Risk Interventions ?   ? View : No data to display.  ?  ?  ?  ? ? ?

## 2022-03-08 NOTE — Progress Notes (Signed)
[facility]) Hospital Liaison: RN note    ?  ?This is a current [facility] hospice patient from [place] memory care with a terminal diagnosis of protein calorie malnutrition. He was transported to ED for evaluation following an unwitnessed fall. Hospice was not notified. He  was admitted to the hospital on 4/19 with a diagnosis of a fractured right femur. Per Dr. Gildardo Cranker this is a related hospital admission.  ?  ?Appropriate for in patient care due to need for symptom management following surgery.  ?  ?Patient sleeping, report exchanged with RN. Recommendations for patient to return to ALF. Hospital SW has reached out to facility and is just waiting to hear back. No acute events overnight. Patient continues with sitter for safety, family not at bedside.  ?  ?VS:  98.8/63/15   110/64   spO2 96% room air ?I/O: 0/1000 ?  ?Abnormal Labs: Chloride 112, CO2 20, BUN 47, Creatinine 1.90, Ca+ 7.9, GFR 34, RBC 2.47, Hgb 7.6, Hct 24, PT 15.4 ?  ?IV/PRN medications: Oxycodone 2.5mg -5mg  PRN PO @ 2143 ?  ?Problem list: ?Fall/right hip fracture: Patient with severe pain of right hip, deformity /decreased range of motion.  Unwitnessed fall at his nursing facility.  Continue pain management, supportive care.  Orthopedics consulted, underwent bipolar hemiarthroplasty with ORIF on 4/20.  Continue pain management, bowel regimen ?  PT/OT evaluation done, recommended home health PT at ALF/memory care on discharge.  TOC following. ?  ?AKI on CKD stage 3b/hyperkalemia: His baseline creatinine ranges from 1.6-1.7.  Presented with creatinine of 2.4, improved with IV fluids.  Hyperkalemia resolved.  Started on sodium bicarb tablets ?  ?Severe dementia: Continue delirium precautions, supportive care, frequent reorientation.  Takes buspirone, Depakote, Haldol, rameron, sertraline, trazodone at rehab center.  Some of the medications have been restarted. ?  ?Normocytic  anemia: Baseline hemoglobin in the range of 11,  hemoglobin has dropped most likely secondary to acute blood loss at fracture site/surgery.  Hemoglobin in the range of 7 today.  Check CBC tomorrow  ? ?Discharge planning: ongoing, likely return to ALF tomorrow ?Family contact: Left VM for daughter ?IDG: updated. ?GOC: DNR  ?  ?Should patient need ambulance transport at discharge please use [facility] as [facility] contracts this service with them for our active hospice patients. ?  ?A Please do not hesitate to call with questions.    ? ?  ?Thank you,    ?Jhonnie Garner, BSN, Therapist, sports, WTA-C ?Hospice hospital liaison ?[phone] ?

## 2022-03-08 NOTE — Evaluation (Signed)
Occupational Therapy Evaluation ?Patient Details ?Name: Jesse BrighamDonald Cherry ?MRN: [id number] ?DOB: 04/22/1933 ?Today's Date: 03/08/2022 ? ? ?History of Present Illness Pt is an 88 y.o. male admitted from [facility]F on 03/07/22 after fall sustaining R intertrochanteric femur and femoral neck fx. S/p R bipolar hemiarthroplasty with ORIF on 4/20. PMH includes advanced dementia, CKD, falls.  ? ?Clinical Impression ?  ?Pt from ALF memory care,  receives assistance from staff as needed or ADLs/IADLs, per daughters and chart review, pt not using AD for mobility. Pt currently mod-max A for ADLs, min A +2 for bed mobility and sit to stand transfer with RW and takes 1-2 side steps toward HOB. Pt following single step instructions with multimodal cues and increased time. Pt presenting with impairments listed below, will follow acutely. Pt/family hopeful for return to ALF for familiar environment, recommend max HHOT at ALF, if not will need SNF.  ?   ? ?Recommendations for follow up therapy are one component of a multi-disciplinary discharge planning process, led by the attending physician.  Recommendations may be updated based on patient status, additional functional criteria and insurance authorization.  ? ?Follow Up Recommendations ? Home health OT (max HHOT at ALF, if not will need SNF)  ?  ?Assistance Recommended at Discharge Frequent or constant Supervision/Assistance  ?Patient can return home with the following A lot of help with walking and/or transfers;A lot of help with bathing/dressing/bathroom;Assistance with cooking/housework;Direct supervision/assist for medications management;Help with stairs or ramp for entrance;Assist for transportation;Direct supervision/assist for financial management ? ?  ?Functional Status Assessment ? Patient has had a recent decline in their functional status and demonstrates the ability to make significant improvements in function in a reasonable and predictable amount of time.   ?Equipment Recommendations ? None recommended by OT;Other (comment) (defer to next venue of care)  ?  ?Recommendations for Other Services PT consult ? ? ?  ?Precautions / Restrictions Precautions ?Precautions: Fall;Other (comment) ?Precaution Comments: urine incontinence ?Restrictions ?Weight Bearing Restrictions: Yes ?RLE Weight Bearing: Weight bearing as tolerated  ? ?  ? ?Mobility Bed Mobility ?Overal bed mobility: Needs Assistance ?Bed Mobility: Supine to Sit ?  ?  ?Supine to sit: Min assist, HOB elevated, +2 for physical assistance ?  ?  ?General bed mobility comments: for scooting pad forward ?  ? ?Transfers ?Overall transfer level: Needs assistance ?Equipment used: Rolling walker (2 wheels) ?Transfers: Sit to/from Stand ?Sit to Stand: Min assist, +2 safety/equipment ?  ?  ?  ?  ?  ?  ?  ? ?  ?Balance Overall balance assessment: Needs assistance ?Sitting-balance support: No upper extremity supported, Feet supported ?Sitting balance-Leahy Scale: Fair ?  ?  ?Standing balance support: Reliant on assistive device for balance ?Standing balance-Leahy Scale: Poor ?Standing balance comment: reliant on BUE support and external assist ?  ?  ?  ?  ?  ?  ?  ?  ?  ?  ?  ?   ? ?ADL either performed or assessed with clinical judgement  ? ?ADL Overall ADL's : Needs assistance/impaired ?Eating/Feeding: Moderate assistance;Sitting ?  ?Grooming: Moderate assistance;Sitting ?  ?Upper Body Bathing: Maximal assistance;Bed level ?  ?Lower Body Bathing: Maximal assistance;Bed level ?  ?Upper Body Dressing : Maximal assistance;Bed level ?  ?Lower Body Dressing: Maximal assistance;Bed level ?  ?Toilet Transfer: Moderate assistance;+2 for physical assistance;Cueing for safety;Cueing for sequencing;Stand-pivot;BSC/3in1;Rolling walker (2 wheels) ?  ?Toileting- Clothing Manipulation and Hygiene: Maximal assistance;Bed level;Sitting/lateral lean ?Toileting - Clothing Manipulation Details (indicate cue type and  reason): for  pericare/toileting ?  ?  ?Functional mobility during ADLs: Minimal assistance;+2 for physical assistance;Rolling walker (2 wheels) ?   ? ? ? ?Vision   ?Vision Assessment?: No apparent visual deficits  ?   ?Perception   ?  ?Praxis   ?  ? ?Pertinent Vitals/Pain Pain Assessment ?Pain Assessment: Faces ?Pain Score: 3  ?Faces Pain Scale: Hurts little more ?Pain Location: R hip with movement ?Pain Descriptors / Indicators: Discomfort, Grimacing, Guarding, Moaning ?Pain Intervention(s): Limited activity within patient's tolerance, Monitored during session, Repositioned  ? ? ? ?Hand Dominance   ?  ?Extremity/Trunk Assessment Upper Extremity Assessment ?Upper Extremity Assessment: Generalized weakness ?  ?Lower Extremity Assessment ?Lower Extremity Assessment: Defer to PT evaluation ?  ?Cervical / Trunk Assessment ?Cervical / Trunk Assessment: Kyphotic ?  ?Communication Communication ?Communication: HOH (hears better out of L ear) ?  ?Cognition Arousal/Alertness: Awake/alert ?Behavior During Therapy: [facility] for tasks assessed/performed, Anxious ?Overall Cognitive Status: History of cognitive impairments - at baseline ?  ?  ?  ?  ?  ?  ?  ?  ?  ?  ?  ?  ?  ?  ?  ?  ?General Comments: h/o advanced dementia, but agreeable and willing to do what is asked with increased cues ?  ?  ?General Comments  pt's daughters present and helpful during session ? ?  ?Exercises   ?  ?Shoulder Instructions    ? ? ?Home Living Family/patient expects to be discharged to:: Assisted living ?  ?  ?  ?  ?  ?  ?  ?  ?  ?  ?  ?  ?  ?  ?  ?  ?Additional Comments: pt resident at [facility]F Memory Care ?  ? ?  ?Prior Functioning/Environment Prior Level of Function : Needs assist;History of Falls (last six months) ? Cognitive Assist : Mobility (cognitive);ADLs (cognitive) ?  ?  ?Physical Assist : ADLs (physical) ?  ?  ?Mobility Comments: typically ambulatory without DME or assist, supervision from staff due to dementia ?ADLs Comments: Staff assists  with all ADL/iADLs as needed ?  ? ?  ?  ?OT Problem List: Decreased strength;Decreased range of motion;Decreased activity tolerance;Impaired balance (sitting and/or standing);Decreased cognition;Decreased safety awareness;Decreased knowledge of use of DME or AE;Decreased knowledge of precautions;Pain;Decreased coordination ?  ?   ?OT Treatment/Interventions: Self-care/ADL training;Therapeutic exercise;DME and/or AE instruction;Therapeutic activities;Visual/perceptual remediation/compensation;Patient/family education  ?  ?OT Goals(Current goals can be found in the care plan section) Acute Rehab OT Goals ?Patient Stated Goal: none stated ?OT Goal Formulation: With patient ?Time For Goal Achievement: 03/22/22 ?Potential to Achieve Goals: Fair ?ADL Goals ?Pt Will Perform Upper Body Dressing: with mod assist;sitting;standing ?Pt Will Perform Lower Body Dressing: with mod assist;sitting/lateral leans;sit to/from stand ?Pt Will Transfer to Toilet: with mod assist;squat pivot transfer;stand pivot transfer;bedside commode ?Pt Will Perform Tub/Shower Transfer: with mod assist;with caregiver independent in assisting;rolling walker;ambulating;shower seat;Shower transfer;Tub transfer  ?OT Frequency: Min 2X/week ?  ? ?Co-evaluation   ?  ?  ?  ?  ? ?  ?AM-PAC OT "6 Clicks" Daily Activity     ?Outcome Measure Help from another person eating meals?: A Little ?Help from another person taking care of personal grooming?: A Lot ?Help from another person toileting, which includes using toliet, bedpan, or urinal?: A Lot ?Help from another person bathing (including washing, rinsing, drying)?: A Lot ?Help from another person to put on and taking off regular upper body clothing?: A Lot ?Help from  another person to put on and taking off regular lower body clothing?: A Lot ?6 Click Score: 13 ?  ?End of Session Equipment Utilized During Treatment: Rolling walker (2 wheels);Gait belt ?Nurse Communication: Mobility status ? ?Activity Tolerance:  Patient tolerated treatment well ?Patient left: in bed;with call bell/phone within reach;with bed alarm set;with family/visitor present ? ?OT Visit Diagnosis: Unsteadiness on feet (R26.81);Other abnormalities of gait and mobilit

## 2022-03-08 NOTE — Progress Notes (Signed)
?PROGRESS NOTE ? ?Jesse Cherry  [id number] DOB: 27-Sep-1933 DOA: 03/05/2022 ?PCP: Jonathon Bellows, PA-C  ? ?Brief Narrative: ?Patient is a 88 year old male with history of advanced dementia who presented from skilled nursing facility memory care after he complained of severe pain on the right hip, found to have right lower extremity rotation after an unwitnessed fall.  On presentation, he was hemodynamically stable.  Right hip x-ray showed acute right femoral intertrochanteric fracture.  CT negative revealed comminuted mildly displaced intertrochanteric fracture of right femur.  CT head without any acute intracranial abnormalities.  Lab work also showed AKI, hyperkalemia.  Orthopedics consulted, underwent bipolar hemiarthroplasty with ORIF on 4/20.  PT/OT evaluation done, recommended home health PT at ALF/memory care on discharge.  TOC following. ? ?Assessment & Plan: ? ?Principal Problem: ?  Hip fracture ([place]) ?Active Problems: ?  Closed right hip fracture ([place]) ? ?Fall/right hip fracture: Patient with severe pain of right hip, deformity /decreased range of motion.  Unwitnessed fall at his nursing facility.  Continue pain management, supportive care.  Orthopedics consulted, underwent bipolar hemiarthroplasty with ORIF on 4/20.  Continue pain management, bowel regimen ?  PT/OT evaluation done, recommended home health PT at ALF/memory care on discharge.  TOC following. ? ?AKI on CKD stage 3b/hyperkalemia: His baseline creatinine ranges from 1.6-1.7.  Presented with creatinine of 2.4, improved with IV fluids.  Hyperkalemia resolved.  Started on sodium bicarb tablets ? ?Severe dementia: Continue delirium precautions, supportive care, frequent reorientation.  Takes buspirone, Depakote, Haldol, rameron, sertraline, trazodone at rehab center.  Some of the medications have been restarted. ? ?Normocytic  anemia: Baseline hemoglobin in the range of 11, hemoglobin has dropped most likely secondary to acute blood  loss at fracture site/surgery.  Hemoglobin in the range of 7 today.  Check CBC tomorrow ? ? ?  ?Nutrition Problem: Increased nutrient needs ?Etiology: post-op healing ?  ? ?DVT prophylaxis:enoxaparin (LOVENOX) injection 30 mg Start: 03/05/22 1000 ? ? ?  Code Status: DNR ? ?Family Communication: Called and discussed with daughter on phone on 4/21 ? ?Patient status:Inpatient ? ?Patient is from :ALF/memory care ? ?Anticipated discharge [id number] ? ?Estimated DC date: likely tomorrow. ? ? ?Consultants: Orthopedics ? ?Procedures:ORIF ? ?Antimicrobials:  ?Anti-infectives (From admission, onward)  ? ? Start     Dose/Rate Route Frequency Ordered Stop  ? 03/07/22 0600  ceFAZolin (ANCEF) IVPB 2g/100 mL premix       ? 2 g ?200 mL/hr over 30 Minutes Intravenous On call to O.R. 03/06/22 1234 03/06/22 1413  ? 03/07/22 0100  ceFAZolin (ANCEF) IVPB 1 g/50 mL premix  Status:  Discontinued       ? 1 g ?100 mL/hr over 30 Minutes Intravenous Every 8 hours 03/06/22 1752 03/06/22 1801  ? 03/07/22 0100  ceFAZolin (ANCEF) IVPB 1 g/50 mL premix       ? 1 g ?100 mL/hr over 30 Minutes Intravenous Every 8 hours 03/06/22 1801 03/07/22 0230  ? 03/06/22 1237  ceFAZolin (ANCEF) 2-4 GM/100ML-% IVPB       ?Note to Pharmacy: Cameron Sprang M: cabinet override  ?    03/06/22 1237 03/06/22 1414  ? ?  ? ? ?Subjective: ?Patient seen and examined at the bedside this morning.  Sitter at the bedside.  He was sleeping as yesterday.  Surgery site checked and it was okay.  Denies any significant pain.  States that he was cold and wanted his blanket back ? ?Objective: ?Vitals:  ? 03/07/22 0700 03/07/22 1949 03/08/22 0624 03/08/22 0835  ?  BP: (!) 106/58 106/64 113/65 110/64  ?Pulse: 64 67 66 63  ?Resp: 13 13 13 15   ?Temp:  98.3 ?F (36.8 ?C) 98.1 ?F (36.7 ?C) 98.8 ?F (37.1 ?C)  ?TempSrc:  Axillary Axillary Oral  ?SpO2:  94% 96% 96%  ?Weight:      ?Height:      ? ? ?Intake/Output Summary (Last 24 hours) at 03/08/2022 1120 ?Last data filed at 03/08/2022 0900 ?Gross  per 24 hour  ?Intake --  ?Output 1000 ml  ?Net -1000 ml  ? ?Filed Weights  ? 03/05/22 1800  ?Weight: 74.7 kg  ? ? ?Examination: ? ?General exam: Overall comfortable, not in distress, very deconditioned, elderly male,  confused ?HEENT: Hard of hearing ?Respiratory system:  no wheezes or crackles  ?Cardiovascular system: S1 & S2 heard, RRR.  ?Gastrointestinal system: Abdomen is nondistended, soft and nontender. ?Central nervous system: Not alert or oriented ?Extremities: No edema, no clubbing ,no cyanosis, surgical wound on the right hip ?Skin: No rashes, no ulcers,no icterus   ? ?Data Reviewed: I have personally reviewed following labs and imaging studies ? ?CBC: ?Recent Labs  ?Lab 03/05/22 ?0124 03/05/22 ?0800 03/05/22 ?1415 03/06/22 ?[id number] 03/07/22 ?0153 03/08/22 ?0125  ?WBC 13.2* 11.2* 9.7 8.3 10.7* 9.9  ?NEUTROABS 10.6*  --   --   --   --   --   ?HGB 8.9* 9.0* 8.5* 8.1* 8.9* 7.6*  ?HCT 29.7* 28.7* 26.6* 26.2* 28.2* 24.0*  ?MCV 105.3* 102.5* 101.1* 101.6* 98.9 97.2  ?PLT 220 229 251 216 226 235  ? ?Basic Metabolic Panel: ?Recent Labs  ?Lab 03/05/22 ?1415 03/06/22 ?[id number] 03/06/22 ?1806 03/07/22 ?0153 03/08/22 ?0125  ?NA 140 142 140 139 138  ?K 6.0* 5.3* 5.3* 5.1 4.5  ?CL 111 113* 114* 112* 112*  ?CO2 23 23 17* 19* 20*  ?GLUCOSE 81 73 115* 148* 93  ?BUN 49* 43* 40* 41* 47*  ?CREATININE 2.17* 2.02* 1.88* 1.94* 1.90*  ?CALCIUM 8.0* 8.4* 8.0* 7.9* 7.9*  ? ? ? ?Recent Results (from the past 240 hour(s))  ?MRSA Next Gen by PCR, Nasal     Status: None  ? Collection Time: 03/05/22  7:10 PM  ? Specimen: Nasal Mucosa; Nasal Swab  ?Result Value Ref Range Status  ? MRSA by PCR Next Gen NOT DETECTED NOT DETECTED Final  ?  Comment: (NOTE) ?The GeneXpert MRSA Assay (FDA approved for NASAL specimens only), ?is one component of a comprehensive MRSA colonization surveillance ?program. It is not intended to diagnose MRSA infection nor to guide ?or monitor treatment for MRSA infections. ?Test performance is not FDA approved in patients  less than 2 years ?old. ?Performed at [facility], [place] [address] [place], [place] ?[address] ?  ?  ? ?Radiology Studies: ?DG HIP UNILAT WITH PELVIS 1V RIGHT ? ?Result Date: 03/06/2022 ?CLINICAL DATA:  Post hemiarthroplasty of the RIGHT hip. EXAM: DG HIP (WITH OR WITHOUT PELVIS) 1V RIGHT COMPARISON:  March 05, 2022. FINDINGS: Post RIGHT hip arthroplasty, single view in AP/oblique projection with changes of RIGHT hip arthroplasty. Acetabular component projects over acetabulum. Cerclage wires surround the proximal aspect of the femoral component about the intratrochanteric and subtrochanteric level as well as along the margin of the greater trochanter. There may be some fragmentation of the greater trochanter but no distraction noted in this area. Gas is present in the soft tissues. IMPRESSION: 1. Post surgical changes of RIGHT hip arthroplasty. 2. Post cerclage fixation about the femoral component with some fragmentation of the medial aspect  of the greater trochanter without distraction. Attention on follow-up. Electronically Signed   By: Zetta Bills M.D.   On: 03/06/2022 16:09  ? ?DG HIP UNILAT W OR W/O PELVIS 2-3 VIEWS RIGHT ? ?Result Date: 03/06/2022 ?CLINICAL DATA:  Right hip arthroplasty EXAM: DG HIP (WITH OR WITHOUT PELVIS) 2-3V RIGHT COMPARISON:  03/05/2022, 03/06/2022 FINDINGS: Two frontal views of the pelvis and right hip as well as a frogleg lateral view of the right hip are obtained. Right hip arthroplasty is identified. Multiple cerclage wires are seen surrounding the proximal femur and greater trochanter, with stable fragmentation of the greater trochanter as seen previously. Alignment is anatomic. Extensive postsurgical changes in the overlying soft tissues. Stable ORIF of a prior left hip fracture. IMPRESSION: 1. Status post right hip arthroplasty. 2. Multiple cerclage wires surround the proximal right hip and arthroplasty components. Stable fragmentation of the greater trochanter. 3. Chronic  ORIF of the left hip. Electronically Signed   By: Randa Ngo M.D.   On: 03/06/2022 19:54   ? ?Scheduled Meds: ? busPIRone  5 mg Oral QHS  ? cholecalciferol  1,000 Units Oral Daily  ? divalproex  12

## 2022-03-09 DIAGNOSIS — S72001A Fracture of unspecified part of neck of right femur, initial encounter for closed fracture: Secondary | ICD-10-CM | POA: Diagnosis not present

## 2022-03-09 LAB — BASIC METABOLIC PANEL
Anion gap: 6 (ref 5–15)
BUN: 38 mg/dL — ABNORMAL HIGH (ref 8–23)
CO2: 24 mmol/L (ref 22–32)
Calcium: 7.9 mg/dL — ABNORMAL LOW (ref 8.9–10.3)
Chloride: 109 mmol/L (ref 98–111)
Creatinine, Ser: 1.77 mg/dL — ABNORMAL HIGH (ref 0.61–1.24)
GFR, Estimated: 37 mL/min — ABNORMAL LOW (ref >60)
Glucose, Bld: 79 mg/dL (ref 70–99)
Potassium: 4.3 mmol/L (ref 3.5–5.1)
Sodium: 139 mmol/L (ref 135–145)

## 2022-03-09 LAB — CBC
HCT: 24.1 % — ABNORMAL LOW (ref 39.0–52.0)
Hemoglobin: 7.7 g/dL — ABNORMAL LOW (ref 13.0–17.0)
MCH: 31.3 pg (ref 26.0–34.0)
MCHC: 32 g/dL (ref 30.0–36.0)
MCV: 98 fL (ref 80.0–100.0)
Platelets: 256 10*3/uL (ref 150–400)
RBC: 2.46 MIL/uL — ABNORMAL LOW (ref 4.22–5.81)
RDW: 15.2 % (ref 11.5–15.5)
WBC: 9.2 10*3/uL (ref 4.0–10.5)
nRBC: 0 % (ref 0.0–0.2)

## 2022-03-09 MED ORDER — ENOXAPARIN SODIUM 30 MG/0.3ML IJ SOSY: 30.0000 mg | PREFILLED_SYRINGE | INTRAMUSCULAR | 0 refills | Status: DC

## 2022-03-09 MED ORDER — HYDROCODONE-ACETAMINOPHEN 5-325 MG PO TABS: 1.0000 | ORAL_TABLET | Freq: Four times a day (QID) | ORAL | 0 refills | Status: DC | PRN

## 2022-03-09 MED ORDER — POLYETHYLENE GLYCOL 3350 17 G PO PACK: 17.0000 g | PACK | Freq: Every day | ORAL | 0 refills | Status: AC

## 2022-03-09 MED ORDER — SODIUM BICARBONATE 650 MG PO TABS
650.0000 mg | ORAL_TABLET | Freq: Two times a day (BID) | ORAL | 0 refills | Status: AC
Start: 2022-03-09 — End: ?

## 2022-03-09 MED ORDER — FERROUS SULFATE 325 (65 FE) MG PO TABS
325.0000 mg | ORAL_TABLET | Freq: Every day | ORAL | 1 refills | Status: AC
Start: 2022-03-09 — End: 2022-05-08

## 2022-03-09 NOTE — Discharge Summary (Signed)
Physician Discharge Summary  ?Jesse Cherry [id number] DOB: 07-11-1933 DOA: 03/05/2022 ? ?PCP: Jonathon Bellows, PA-C ? ?Admit date: 03/05/2022 ?Discharge date: 03/09/2022 ? ?Admitted From: ALF ?Disposition: ALF ? ?Discharge Condition:Stable ?CODE STATUS:FULL ?Diet recommendation: Regular   ? ?Brief/Interim Summary: ? ?Patient is a 88 year old male with history of advanced dementia who presented from skilled nursing facility memory care after he complained of severe pain on the right hip, found to have right lower extremity rotation after an unwitnessed fall.  On presentation, he was hemodynamically stable.  Right hip x-ray showed acute right femoral intertrochanteric fracture.  CT negative revealed comminuted mildly displaced intertrochanteric fracture of right femur.  CT head without any acute intracranial abnormalities.  Lab work also showed AKI, hyperkalemia.  Orthopedics consulted, underwent bipolar hemiarthroplasty with ORIF on 4/20.  PT/OT evaluation done, recommended home health PT at ALF/memory care on discharge.  Medically stable for discharge.  He will follow-up with orthopedics in 2 weeks. ? ?Following problems were addressed during his hospitalization: ? ?Fall/right hip fracture: Patient with severe pain of right hip, deformity /decreased range of motion.  Unwitnessed fall at his nursing facility.  Continue pain management, supportive care.  Orthopedics consulted, underwent bipolar hemiarthroplasty with ORIF on 4/20.  Continue pain management, bowel regimen ?  PT/OT evaluation done, recommended home health PT at ALF/memory care on discharge.  TOC following. ?  ?AKI on CKD stage 3b/hyperkalemia: His baseline creatinine ranges from 1.6-1.7.  Presented with creatinine of 2.4, improved with IV fluids.  Hyperkalemia resolved.  Started on sodium bicarb tablets ?  ?Severe dementia: Continue delirium precautions, supportive care, frequent reorientation.  Takes buspirone, Depakote, Haldol, rameron,  sertraline, trazodone at memory care ?  ?Normocytic  anemia: Baseline hemoglobin in the range of 11, hemoglobin has dropped most likely secondary to acute blood loss at fracture site/surgery.  Hemoglobin in the range of 7 today.  Started on iron supplements.Check CBC in a week ? ?Discharge Diagnoses:  ?Principal Problem: ?  Hip fracture ([place]) ?Active Problems: ?  Closed right hip fracture ([place]) ? ? ? ?Discharge Instructions ? ?Discharge Instructions   ? ? Diet general   Complete by: As directed ?  ? Discharge instructions   Complete by: As directed ?  ? 1)Please take prescribed medications as instructed ?2)Do a CBC test in a week to check your hemoglobin ?3)Follow up with orthopedics in 2 weeks for follow-up  x-rays and suture removal  ? Increase activity slowly   Complete by: As directed ?  ? No wound care   Complete by: As directed ?  ? ?  ? ?Allergies as of 03/09/2022   ? ?   Reactions  ? Demerol [meperidine Hcl]   ? ?  ? ?  ?Medication List  ?  ? ?STOP taking these medications   ? ?naproxen 500 MG tablet ?Commonly known as: NAPROSYN ?  ?polyethylene glycol powder 17 GM/SCOOP powder ?Commonly known as: GLYCOLAX/MIRALAX ?Replaced by: polyethylene glycol 17 g packet ?  ? ?  ? ?TAKE these medications   ? ?busPIRone 5 MG tablet ?Commonly known as: BUSPAR ?Take 5 mg by mouth at bedtime. ?  ?divalproex 125 MG DR tablet ?Commonly known as: DEPAKOTE ?Take 125 mg by mouth 2 (two) times daily. ?  ?enoxaparin 30 MG/0.3ML injection ?Commonly known as: LOVENOX ?Inject 0.3 mLs (30 mg total) into the skin daily for 26 days. ?Start taking on: March 10, 2022 ?  ?ferrous sulfate 325 (65 FE) MG tablet ?Take 1 tablet (325 mg total)  by mouth daily. ?  ?haloperidol 0.5 MG tablet ?Commonly known as: HALDOL ?Take 0.5 mg by mouth every 8 (eight) hours as needed for agitation. ?  ?HYDROcodone-acetaminophen 5-325 MG tablet ?Commonly known as: NORCO/VICODIN ?Take 1 tablet by mouth every 6 (six) hours as needed for moderate pain. ?   ?METAMUCIL PO ?Take 0.4 g by mouth in the morning and at bedtime. ?  ?mirtazapine 15 MG tablet ?Commonly known as: REMERON ?Take 15 mg by mouth at bedtime. ?  ?polyethylene glycol 17 g packet ?Commonly known as: MIRALAX / GLYCOLAX ?Take 17 g by mouth daily. ?Start taking on: March 10, 2022 ?Replaces: polyethylene glycol powder 17 GM/SCOOP powder ?  ?sennosides-docusate sodium 8.6-50 MG tablet ?Commonly known as: SENOKOT-S ?Take 1 tablet by mouth See admin instructions. At bedtime on Monday,Wednesday and friday ?  ?sertraline 25 MG tablet ?Commonly known as: ZOLOFT ?Take 25 mg by mouth daily. ?  ?sodium bicarbonate 650 MG tablet ?Take 1 tablet (650 mg total) by mouth 2 (two) times daily. ?  ?traZODone 50 MG tablet ?Commonly known as: DESYREL ?Take 50 mg by mouth at bedtime. ?  ?Vitamin D3 25 MCG tablet ?Commonly known as: Vitamin D ?Take 1,000 Units by mouth daily. ?  ? ?  ? ? Contact information for follow-up providers   ? ? Willaim Sheng, MD. Schedule an appointment as soon as possible for a visit in 2 week(s).   ?Specialty: Orthopedic Surgery ?Contact information: ?[address] ?[place] [address] ?[phone] ? ? ?  ?  ? ?  ?  ? ? Contact information for after-discharge care   ? ? Destination   ? ? [facility] .   ?Service: Assisted Living ?Contact information: ?[id number] ?[place] ?[phone] ? ?  ?  ? ?  ?  ? ?  ?  ? ?  ? ?Allergies  ?Allergen Reactions  ? Demerol [Meperidine Hcl]   ? ? ?Consultations: ?Orthopedics ? ? ?Procedures/Studies: ?DG Chest 1 View ? ?Result Date: 03/05/2022 ?CLINICAL DATA:  Fall. EXAM: CHEST  1 VIEW COMPARISON:  Chest x-ray 12/23/2021. FINDINGS: The aorta is ectatic, unchanged. Heart is mildly enlarged. There is no focal lung infiltrate, pleural effusion or pneumothorax. Surgical clips are seen in the upper abdomen. No acute fractures are identified. IMPRESSION: 1. No acute cardiopulmonary process. 2. Stable  cardiomegaly. Electronically Signed   By: Ronney Asters M.D.   On: 03/05/2022 01:57  ? ?CT HEAD WO CONTRAST ? ?Result Date: 03/05/2022 ?CLINICAL DATA:  Head trauma. EXAM: CT HEAD WITHOUT CONTRAST TECHNIQUE: Contiguous axial images were obtained from the base of the skull through the vertex without intravenous contrast. RADIATION DOSE REDUCTION: This exam was performed according to the departmental dose-optimization program which includes automated exposure control, adjustment of the mA and/or kV according to patient size and/or use of iterative reconstruction technique. COMPARISON:  Head CT dated 12/23/2021. FINDINGS: Brain: Mild age-related atrophy and chronic microvascular ischemic changes. There is no acute intracranial hemorrhage. No mass effect or midline shift. No extra-axial fluid collection. Vascular: No hyperdense vessel or unexpected calcification. Skull: Normal. Negative for fracture or focal lesion. Sinuses/Orbits: Mild diffuse mucoperiosteal thickening of paranasal sinuses. No air-fluid level. The mastoid air cells are clear. Other: None IMPRESSION: 1. No acute intracranial pathology. 2. Mild age-related atrophy and chronic microvascular ischemic changes. Electronically Signed   By: Anner Crete M.D.   On: 03/05/2022 03:28  ? ?CT HIP RIGHT WO CONTRAST ? ?  Result Date: 03/05/2022 ?CLINICAL DATA:  Fracture hip, unwitnessed fall. EXAM: CT OF THE RIGHT HIP WITHOUT CONTRAST TECHNIQUE: Multidetector CT imaging of the right hip was performed according to the standard protocol. Multiplanar CT image reconstructions were also generated. RADIATION DOSE REDUCTION: This exam was performed according to the departmental dose-optimization program which includes automated exposure control, adjustment of the mA and/or kV according to patient size and/or use of iterative reconstruction technique. COMPARISON:  Radiographs dated January 05, 2022. FINDINGS: Bones/Joint/Cartilage There is comminuted mildly displaced  intertrochanteric fracture of the right femur with superolateral displacement of the distal femur. No pelvic fracture. Sacroiliac joint and pubic symphysis are maintained. The femoral head is located within the acetabul

## 2022-03-09 NOTE — Progress Notes (Signed)
Telephone report called to [facility]. I spoke with Janine Limbo and gave report and answered all her questions. ?

## 2022-03-09 NOTE — TOC Transition Note (Signed)
Transition of Care (TOC) - CM/SW Discharge Note ? ? ?Patient Details  ?Name: Jesse Cherry ?MRN: [id number] ?Date of Birth: 07/14/1933 ? ?Transition of Care (TOC) CM/SW Contact:  ?Amire Leazer, [place], [place] ?Phone Number: ?03/09/2022, 10:37 AM ? ? ?Clinical Narrative:    ?Patient to return to [facility] today-memory care unit with Hospice services through [facility]. Patient will be going by non-emergent ambulance. Report to be called in to [phone]. ? ?[employer], LCSW ?Transition of Care ? ? ? ?  ?Barriers to Discharge: Continued Medical Work up ? ? ?Patient Goals and CMS Choice ?  ?  ?  ? ?Discharge Placement ?  ?           ?  ?  ?  ?  ? ?Discharge Plan and Services ?  ?  ?           ?  ?  ?  ?  ?  ?  ?[place]: Hospice and [facility] ?  ?  ?Representative spoke with at [place]: [facility] ? ?Social Determinants of Health (SDOH) Interventions ?  ? ? ?Readmission Risk Interventions ?   ? View : No data to display.  ?  ?  ?  ? ? ? ? ? ?

## 2022-03-10 ENCOUNTER — Encounter (HOSPITAL_COMMUNITY): Payer: Self-pay | Admitting: Radiology

## 2022-03-10 ENCOUNTER — Emergency Department (HOSPITAL_COMMUNITY)

## 2022-03-10 ENCOUNTER — Emergency Department (HOSPITAL_COMMUNITY)
Admission: EM | Admit: 2022-03-10 | Discharge: 2022-03-10 | Disposition: A | Discharge status: Home / Self Care | Attending: Emergency Medicine | Admitting: Emergency Medicine

## 2022-03-10 DIAGNOSIS — Z96641 Presence of right artificial hip joint: Secondary | ICD-10-CM | POA: Diagnosis not present

## 2022-03-10 DIAGNOSIS — M25551 Pain in right hip: Secondary | ICD-10-CM | POA: Diagnosis not present

## 2022-03-10 DIAGNOSIS — W19XXXA Unspecified fall, initial encounter: Secondary | ICD-10-CM

## 2022-03-10 DIAGNOSIS — F0390 Unspecified dementia without behavioral disturbance: Secondary | ICD-10-CM | POA: Insufficient documentation

## 2022-03-10 DIAGNOSIS — W1839XA Other fall on same level, initial encounter: Secondary | ICD-10-CM | POA: Diagnosis not present

## 2022-03-10 MED ORDER — TRAZODONE HCL 100 MG PO TABS
50.0000 mg | ORAL_TABLET | Freq: Every day | ORAL | Status: DC
Start: 2022-03-10 — End: 2022-03-11
  Administered 2022-03-10: 50 mg via ORAL
  Filled 2022-03-10: qty 1

## 2022-03-10 MED ORDER — BUSPIRONE HCL 10 MG PO TABS
5.0000 mg | ORAL_TABLET | Freq: Every day | ORAL | Status: DC
  Administered 2022-03-10: 5 mg via ORAL
  Filled 2022-03-10: qty 1

## 2022-03-10 NOTE — ED Provider Notes (Signed)
?[facility] DEPT ?Provider Note ? ? ?CSN: [id number] ?Arrival date & time: 03/10/22  1906 ? ?  ? ?History ? ?Chief Complaint  ?Patient presents with  ? Fall  ? ? ?Jesse Cherry is a 88 y.o. male. ? ? ?Fall ?Patient brought after fall.  Recent right hip replacement.  Has severe dementia at baseline.  Also difficulty hearing.  Patient's family member arrived later.  States he is at baseline at this point.  He is apparently without complaints but difficult to get any history from.  Reportedly did not hit head and is potentially on low-dose Lovenox but does not appear to be on other blood thinners. ? ?  ? ?Home Medications ?Prior to Admission medications   ?Medication Sig Start Date End Date Taking? Authorizing Provider  ?busPIRone (BUSPAR) 5 MG tablet Take 5 mg by mouth at bedtime.    [provider]  ?divalproex (DEPAKOTE) 125 MG DR tablet Take 125 mg by mouth 2 (two) times daily.    [provider]  ?enoxaparin (LOVENOX) 30 MG/0.3ML injection Inject 0.3 mLs (30 mg total) into the skin daily for 26 days. 03/10/22 04/05/22  Shelly Coss, MD  ?ferrous sulfate 325 (65 FE) MG tablet Take 1 tablet (325 mg total) by mouth daily. 03/09/22 05/08/22  Shelly Coss, MD  ?haloperidol (HALDOL) 0.5 MG tablet Take 0.5 mg by mouth every 8 (eight) hours as needed for agitation.    [provider]  ?HYDROcodone-acetaminophen (NORCO/VICODIN) 5-325 MG tablet Take 1 tablet by mouth every 6 (six) hours as needed for moderate pain. 03/09/22   Shelly Coss, MD  ?mirtazapine (REMERON) 15 MG tablet Take 15 mg by mouth at bedtime.    [provider]  ?polyethylene glycol (MIRALAX / GLYCOLAX) 17 g packet Take 17 g by mouth daily. 03/10/22   Shelly Coss, MD  ?Psyllium (METAMUCIL PO) Take 0.4 g by mouth in the morning and at bedtime.    [provider]  ?sennosides-docusate sodium (SENOKOT-S) 8.6-50 MG tablet Take 1 tablet by mouth See admin instructions. At bedtime  on Monday,Wednesday and friday    [provider]  ?sertraline (ZOLOFT) 25 MG tablet Take 25 mg by mouth daily.    [provider]  ?sodium bicarbonate 650 MG tablet Take 1 tablet (650 mg total) by mouth 2 (two) times daily. 03/09/22   Shelly Coss, MD  ?traZODone (DESYREL) 50 MG tablet Take 50 mg by mouth at bedtime.    [provider]  ?Vitamin D3 (VITAMIN D) 25 MCG tablet Take 1,000 Units by mouth daily.    [provider]  ?   ? ?Allergies    ?Demerol [meperidine hcl]   ? ?Review of Systems   ?Review of Systems ? ?Physical Exam ?Updated Vital Signs ?BP 107/60   Pulse 60   Temp 97.6 ?F (36.4 ?C) (Oral)   Resp 15   Ht 5\' 7"  (1.702 m)   Wt 74.7 kg   SpO2 96%   BMI 25.79 kg/m?  ?Physical Exam ?Vitals and nursing note reviewed.  ?HENT:  ?   Head: Atraumatic.  ?Cardiovascular:  ?   Rate and Rhythm: Regular rhythm.  ?Abdominal:  ?   Tenderness: There is no abdominal tenderness.  ?Musculoskeletal:  ?   Cervical back: Neck supple. No tenderness.  ?   Comments: Dressing intact on right hip.  Some pain with movement of the hip but no deformity.  No tenderness over knee or ankle on the right side.  No tenderness  on left lower extremity.  Good range of motion upper extremities.  No cervical thoracic or lumbar spine tenderness.  No evidence of trauma on his head.  ?Skin: ?   General: Skin is warm.  ?   Capillary Refill: Capillary refill takes less than 2 seconds.  ?Neurological:  ?   Mental Status: He is alert. Mental status is at baseline.  ? ? ?ED Results / Procedures / Treatments   ?Labs ?(all labs ordered are listed, but only abnormal results are displayed) ?Labs Reviewed - No data to display ? ?EKG ?None ? ?Radiology ?DG Hip Unilat W or Wo Pelvis 2-3 Views Right ? ?Result Date: 03/10/2022 ?CLINICAL DATA:  Fall out of bed today.  Right hip surgery last week. EXAM: DG HIP (WITH OR WITHOUT PELVIS) 2-3V RIGHT COMPARISON:  Right hip radiograph 03/06/2022 FINDINGS: Right hip  arthroplasty in unchanged alignment. Cerclage wires including a cerclage wire about the greater trochanteric, unchanged from prior exam. There is no periprosthetic fracture. No periprosthetic lucency. Intact pubic rami. Persistent changes in the soft tissues from recent surgery with soft tissue air and edema, diminished. Pubic symphysis and sacroiliac joints are congruent. Surgical hardware in the left proximal femur is intact were visualized. IMPRESSION: Right hip arthroplasty without complication or acute fracture. Electronically Signed   By: Keith Rake M.D.   On: 03/10/2022 19:59   ? ?Procedures ?Procedures  ? ? ?Medications Ordered in ED ?Medications - No data to display ? ?ED Course/ Medical Decision Making/ A&P ?  ?                        ?Medical Decision Making ?Amount and/or Complexity of Data Reviewed ?Radiology: ordered. ? ? ?Patient with fall.  Recent right hip surgery.  Due to dementia cannot really get any history.  Family numbers here however.  X-ray done and reassuring.  Independently interpreted.  Hardware intact.  At baseline.  Did not hit head.  With dementia and no clear findings family thinks it is reasonable for discharge I agree with them.  Likely do better at home without any further work-up.  Will discharge home. ? ? ? ? ? ? ? ?Final Clinical Impression(s) / ED Diagnoses ?Final diagnoses:  ?None  ? ? ?Rx / DC Orders ?ED Discharge Orders   ? ? None  ? ?  ? ? ?  ?Davonna Belling, MD ?03/11/22 2355 ? ?

## 2022-03-10 NOTE — ED Triage Notes (Addendum)
Pt had right hip replacement surgery last week and fell out of his bed today. Complaining of right leg pain. Dressing remains intact. Pt from [facility]. Pt did not hit his head and isnt on blood thinners. ?

## 2022-03-11 ENCOUNTER — Telehealth: Payer: Self-pay

## 2022-03-11 ENCOUNTER — Emergency Department (HOSPITAL_COMMUNITY)

## 2022-03-11 ENCOUNTER — Inpatient Hospital Stay (HOSPITAL_COMMUNITY)
Admission: EM | Admit: 2022-03-11 | Discharge: 2022-03-18 | DRG: 690 | Disposition: A | Discharge status: Skilled Nursing Facility | Source: Skilled Nursing Facility | Attending: Internal Medicine | Admitting: Internal Medicine

## 2022-03-11 ENCOUNTER — Encounter (HOSPITAL_COMMUNITY): Payer: Self-pay | Admitting: Orthopedic Surgery

## 2022-03-11 ENCOUNTER — Observation Stay (HOSPITAL_COMMUNITY)

## 2022-03-11 DIAGNOSIS — N390 Urinary tract infection, site not specified: Secondary | ICD-10-CM | POA: Diagnosis present

## 2022-03-11 DIAGNOSIS — G629 Polyneuropathy, unspecified: Secondary | ICD-10-CM | POA: Diagnosis present

## 2022-03-11 DIAGNOSIS — Z96643 Presence of artificial hip joint, bilateral: Secondary | ICD-10-CM | POA: Diagnosis present

## 2022-03-11 DIAGNOSIS — N183 Chronic kidney disease, stage 3 unspecified: Secondary | ICD-10-CM | POA: Diagnosis present

## 2022-03-11 DIAGNOSIS — Z885 Allergy status to narcotic agent status: Secondary | ICD-10-CM

## 2022-03-11 DIAGNOSIS — Z751 Person awaiting admission to adequate facility elsewhere: Secondary | ICD-10-CM

## 2022-03-11 DIAGNOSIS — N400 Enlarged prostate without lower urinary tract symptoms: Secondary | ICD-10-CM | POA: Diagnosis present

## 2022-03-11 DIAGNOSIS — Z66 Do not resuscitate: Secondary | ICD-10-CM | POA: Diagnosis present

## 2022-03-11 DIAGNOSIS — F03C4 Unspecified dementia, severe, with anxiety: Secondary | ICD-10-CM | POA: Diagnosis present

## 2022-03-11 DIAGNOSIS — Z9049 Acquired absence of other specified parts of digestive tract: Secondary | ICD-10-CM

## 2022-03-11 DIAGNOSIS — F0390 Unspecified dementia without behavioral disturbance: Secondary | ICD-10-CM

## 2022-03-11 DIAGNOSIS — W19XXXA Unspecified fall, initial encounter: Principal | ICD-10-CM

## 2022-03-11 DIAGNOSIS — Y92239 Unspecified place in hospital as the place of occurrence of the external cause: Secondary | ICD-10-CM | POA: Diagnosis not present

## 2022-03-11 DIAGNOSIS — R296 Repeated falls: Secondary | ICD-10-CM | POA: Diagnosis not present

## 2022-03-11 DIAGNOSIS — H9190 Unspecified hearing loss, unspecified ear: Secondary | ICD-10-CM | POA: Diagnosis present

## 2022-03-11 DIAGNOSIS — S72141D Displaced intertrochanteric fracture of right femur, subsequent encounter for closed fracture with routine healing: Secondary | ICD-10-CM

## 2022-03-11 DIAGNOSIS — Z79899 Other long term (current) drug therapy: Secondary | ICD-10-CM

## 2022-03-11 DIAGNOSIS — T43025A Adverse effect of tetracyclic antidepressants, initial encounter: Secondary | ICD-10-CM | POA: Diagnosis not present

## 2022-03-11 DIAGNOSIS — N3001 Acute cystitis with hematuria: Secondary | ICD-10-CM

## 2022-03-11 DIAGNOSIS — N1832 Chronic kidney disease, stage 3b: Secondary | ICD-10-CM | POA: Diagnosis present

## 2022-03-11 DIAGNOSIS — X58XXXD Exposure to other specified factors, subsequent encounter: Secondary | ICD-10-CM | POA: Diagnosis present

## 2022-03-11 DIAGNOSIS — S72002D Fracture of unspecified part of neck of left femur, subsequent encounter for closed fracture with routine healing: Secondary | ICD-10-CM

## 2022-03-11 DIAGNOSIS — N3000 Acute cystitis without hematuria: Principal | ICD-10-CM | POA: Diagnosis present

## 2022-03-11 DIAGNOSIS — D631 Anemia in chronic kidney disease: Secondary | ICD-10-CM | POA: Diagnosis present

## 2022-03-11 DIAGNOSIS — T43215A Adverse effect of selective serotonin and norepinephrine reuptake inhibitors, initial encounter: Secondary | ICD-10-CM | POA: Diagnosis not present

## 2022-03-11 DIAGNOSIS — R400 Somnolence: Secondary | ICD-10-CM | POA: Diagnosis not present

## 2022-03-11 LAB — URINALYSIS, ROUTINE W REFLEX MICROSCOPIC
Bilirubin Urine: NEGATIVE
Glucose, UA: NEGATIVE mg/dL
Ketones, ur: NEGATIVE mg/dL
Nitrite: NEGATIVE
Protein, ur: NEGATIVE mg/dL
Specific Gravity, Urine: 1.018 (ref 1.005–1.030)
pH: 5 (ref 5.0–8.0)

## 2022-03-11 LAB — COMPREHENSIVE METABOLIC PANEL
ALT: 7 U/L (ref 0–44)
AST: 16 U/L (ref 15–41)
Albumin: 2.7 g/dL — ABNORMAL LOW (ref 3.5–5.0)
Alkaline Phosphatase: 61 U/L (ref 38–126)
Anion gap: 10 (ref 5–15)
BUN: 38 mg/dL — ABNORMAL HIGH (ref 8–23)
CO2: 22 mmol/L (ref 22–32)
Calcium: 8.1 mg/dL — ABNORMAL LOW (ref 8.9–10.3)
Chloride: 106 mmol/L (ref 98–111)
Creatinine, Ser: 1.8 mg/dL — ABNORMAL HIGH (ref 0.61–1.24)
GFR, Estimated: 36 mL/min — ABNORMAL LOW (ref >60)
Glucose, Bld: 99 mg/dL (ref 70–99)
Potassium: 4.8 mmol/L (ref 3.5–5.1)
Sodium: 138 mmol/L (ref 135–145)
Total Bilirubin: 0.7 mg/dL (ref 0.3–1.2)
Total Protein: 6.3 g/dL — ABNORMAL LOW (ref 6.5–8.1)

## 2022-03-11 LAB — CBC WITH DIFFERENTIAL/PLATELET
Abs Immature Granulocytes: 0.33 10*3/uL — ABNORMAL HIGH (ref 0.00–0.07)
Basophils Absolute: 0.1 10*3/uL (ref 0.0–0.1)
Basophils Relative: 1 %
Eosinophils Absolute: 0.5 10*3/uL (ref 0.0–0.5)
Eosinophils Relative: 4 %
HCT: 30 % — ABNORMAL LOW (ref 39.0–52.0)
Hemoglobin: 9.7 g/dL — ABNORMAL LOW (ref 13.0–17.0)
Immature Granulocytes: 3 %
Lymphocytes Relative: 16 %
Lymphs Abs: 1.9 10*3/uL (ref 0.7–4.0)
MCH: 31.6 pg (ref 26.0–34.0)
MCHC: 32.3 g/dL (ref 30.0–36.0)
MCV: 97.7 fL (ref 80.0–100.0)
Monocytes Absolute: 0.7 10*3/uL (ref 0.1–1.0)
Monocytes Relative: 6 %
Neutro Abs: 8.6 10*3/uL — ABNORMAL HIGH (ref 1.7–7.7)
Neutrophils Relative %: 70 %
Platelets: 319 10*3/uL (ref 150–400)
RBC: 3.07 MIL/uL — ABNORMAL LOW (ref 4.22–5.81)
RDW: 14.6 % (ref 11.5–15.5)
WBC: 12.1 10*3/uL — ABNORMAL HIGH (ref 4.0–10.5)
nRBC: 0 % (ref 0.0–0.2)

## 2022-03-11 MED ORDER — POLYETHYLENE GLYCOL 3350 17 G PO PACK: 17.0000 g | PACK | Freq: Every day | ORAL | Status: DC | PRN

## 2022-03-11 MED ORDER — SODIUM CHLORIDE 0.9 % IV SOLN
1.0000 g | INTRAVENOUS | Status: DC
  Administered 2022-03-11 – 2022-03-14 (×4): 1 g via INTRAVENOUS
  Filled 2022-03-11 (×4): qty 10

## 2022-03-11 MED ORDER — SERTRALINE HCL 25 MG PO TABS
25.0000 mg | ORAL_TABLET | Freq: Every day | ORAL | Status: DC
  Administered 2022-03-12 – 2022-03-18 (×7): 25 mg via ORAL
  Filled 2022-03-11 (×7): qty 1

## 2022-03-11 MED ORDER — DIVALPROEX SODIUM 125 MG PO DR TAB
125.0000 mg | DELAYED_RELEASE_TABLET | Freq: Two times a day (BID) | ORAL | Status: DC
  Administered 2022-03-11 – 2022-03-18 (×14): 125 mg via ORAL
  Filled 2022-03-11 (×15): qty 1

## 2022-03-11 MED ORDER — MIRTAZAPINE 15 MG PO TABS
15.0000 mg | ORAL_TABLET | Freq: Every day | ORAL | Status: DC
  Administered 2022-03-11 – 2022-03-13 (×3): 15 mg via ORAL
  Filled 2022-03-11 (×4): qty 1

## 2022-03-11 MED ORDER — SODIUM CHLORIDE 0.9% FLUSH
3.0000 mL | Freq: Two times a day (BID) | INTRAVENOUS | Status: DC
  Administered 2022-03-12 – 2022-03-18 (×11): 3 mL via INTRAVENOUS

## 2022-03-11 MED ORDER — FERROUS SULFATE 325 (65 FE) MG PO TABS
325.0000 mg | ORAL_TABLET | Freq: Every day | ORAL | Status: DC
Start: 2022-03-12 — End: 2022-03-18
  Administered 2022-03-12 – 2022-03-18 (×7): 325 mg via ORAL
  Filled 2022-03-11 (×7): qty 1

## 2022-03-11 MED ORDER — BUSPIRONE HCL 5 MG PO TABS
5.0000 mg | ORAL_TABLET | Freq: Every day | ORAL | Status: DC
  Administered 2022-03-11 – 2022-03-17 (×7): 5 mg via ORAL
  Filled 2022-03-11 (×7): qty 1

## 2022-03-11 MED ORDER — ACETAMINOPHEN 325 MG PO TABS
650.0000 mg | ORAL_TABLET | Freq: Four times a day (QID) | ORAL | Status: DC | PRN
  Administered 2022-03-12 – 2022-03-17 (×9): 650 mg via ORAL
  Filled 2022-03-11 (×11): qty 2

## 2022-03-11 MED ORDER — HALOPERIDOL 0.5 MG PO TABS
0.5000 mg | ORAL_TABLET | Freq: Three times a day (TID) | ORAL | Status: DC | PRN
  Administered 2022-03-13 – 2022-03-17 (×6): 0.5 mg via ORAL
  Filled 2022-03-11 (×8): qty 1

## 2022-03-11 MED ORDER — ENOXAPARIN SODIUM 30 MG/0.3ML IJ SOSY
30.0000 mg | PREFILLED_SYRINGE | INTRAMUSCULAR | Status: DC
  Administered 2022-03-11 – 2022-03-17 (×7): 30 mg via SUBCUTANEOUS
  Filled 2022-03-11 (×7): qty 0.3

## 2022-03-11 MED ORDER — TRAZODONE HCL 50 MG PO TABS
50.0000 mg | ORAL_TABLET | Freq: Every day | ORAL | Status: DC
  Administered 2022-03-11 – 2022-03-13 (×3): 50 mg via ORAL
  Filled 2022-03-11 (×3): qty 1

## 2022-03-11 MED ORDER — SODIUM BICARBONATE 650 MG PO TABS
650.0000 mg | ORAL_TABLET | Freq: Two times a day (BID) | ORAL | Status: DC
  Administered 2022-03-11 – 2022-03-18 (×14): 650 mg via ORAL
  Filled 2022-03-11 (×14): qty 1

## 2022-03-11 MED ORDER — ACETAMINOPHEN 650 MG RE SUPP: 650.0000 mg | Freq: Four times a day (QID) | RECTAL | Status: DC | PRN

## 2022-03-11 MED ORDER — SULFAMETHOXAZOLE-TRIMETHOPRIM 800-160 MG PO TABS
1.0000 | ORAL_TABLET | Freq: Once | ORAL | Status: AC
  Administered 2022-03-11: 1 via ORAL
  Filled 2022-03-11: qty 1

## 2022-03-11 NOTE — ED Notes (Signed)
Daughter at [facility].

## 2022-03-11 NOTE — Progress Notes (Addendum)
Called regarding new fall in patient with recent right hip hemiarthroplasty with Dr. Blanchie Dessert on 4/20. Will plan to get repeat right hip CT and discuss with Dr. Blanchie Dessert. For now he will be TTWB RLE. Full consult to follow.  ?  ?Janine Ores, PA-C ?

## 2022-03-11 NOTE — ED Triage Notes (Signed)
Pt arrives via EMS from [facility] with reports of right hip pain. Pt had hip replacement last week. Seen at [facility] yesterday for fall.  ?

## 2022-03-11 NOTE — Progress Notes (Deleted)
Called regarding new fall in patient with recent right hip hemiarthroplasty with Dr. Blanchie Dessert on 4/20. Will plan to get repeat right hip CT and discuss with Dr. Blanchie Dessert. For now he will be TTWB RLE. Full consults to follow.  ? ?Janine Ores, PA-C ?

## 2022-03-11 NOTE — H&P (Signed)
?History and Physical  ? ?Jesse HillDonald Cherry UJW:[id number] DOB: 03/19/1933 DOA: 03/11/2022 ? ?PCP: Bailey MechPodraza, Cole Christopher, PA-C  ? ?Patient coming from: Memory care unit at SNF ? ?Chief Complaint: Fall, hip pain ? ?HPI: Jesse BrighamDonald Cherry is a 88 y.o. male with medical history significant of hip fracture, dementia, CKD, anemia, anxiety, BPH, neuropathy presenting after a fall at his memory care unit. ? ?History provided with assistance of daughter and chart review due to patient's baseline dementia.  Had another fall at his memory care unit today.  Fall was unwitnessed.  Patient has had multiple falls recently including a fall on 419 resulting in surgical repair of hip.  Had a fall yesterday and was evaluated [facility] ED which had a negative work-up and was sent back to facility and subsequently fell again today. ? ?Daughter reports Jesse Cherry had not started any PT or OT at the facility after his admission for the hip fracture repair.  And the rate of falls increased with these 2 falls back-to-back since the hip repair.  Very weak but also due to his dementia gets confused/panic if Jesse Cherry wakes up and on his they are Jesse Cherry tries to get out of bed but Jesse Cherry is unable to. ? ?ED Course: Vital signs in the ED stable.  Lab work-up included CMP with BUN 38 and creatinine elevated to 1.8 which is stable for him, calcium 8.1, protein 6.3, albumin 2.7.  CBC with hemoglobin stable at 9.7 and new leukocytosis of 12.1.  Urinalysis showed moderate hemoglobin small leukocytes and few bacteria.  Urine cultures and blood cultures pending.  CT head showed no acute abnormality, CT C-spine showed no acute abnormality.  Hip x-ray on the right showed arthroplasty status post fixation with slight increase in displacement of fracture fragment since imaging done yesterday.  Patient received a dose of Bactrim in the ED.  They attempted to ambulate him but Jesse Cherry is very unsteady and was only able to walk a short distance even with two-person assist.  Orthopedics  was consulted and state they will see the patient tomorrow regarding changes in fixated hip fracture fragment. ? ?Review of Systems: Patient unable to fully participate in review of systems secondary to severe dementia ? ?Past Medical History:  ?Diagnosis Date  ? Chronic kidney disease   ? Dementia (HCC)   ? ? ?Past Surgical History:  ?Procedure Laterality Date  ? CHOLECYSTECTOMY    ? HIP ARTHROPLASTY Right 03/06/2022  ? Procedure: ARTHROPLASTY BIPOLAR HIP (HEMIARTHROPLASTY);  Surgeon: Joen LauraMarchwiany, Daniel A, MD;  [facility];  Service: Orthopedics;  Laterality: Right;  ? TOTAL HIP ARTHROPLASTY Left   ? ? ?Social History ? reports that Jesse Cherry has never smoked. Jesse Cherry has never used smokeless tobacco. Jesse Cherry reports that Jesse Cherry does not drink alcohol and does not use drugs. ? ?Allergies  ?Allergen Reactions  ? Demerol [Meperidine Hcl]   ? ? ?No family history on file. ? ? ?Prior to Admission medications   ?Medication Sig Start Date End Date Taking? Authorizing Provider  ?busPIRone (BUSPAR) 5 MG tablet Take 5 mg by mouth at bedtime.    [provider]  ?divalproex (DEPAKOTE) 125 MG DR tablet Take 125 mg by mouth 2 (two) times daily.    [provider]  ?enoxaparin (LOVENOX) 30 MG/0.3ML injection Inject 0.3 mLs (30 mg total) into the skin daily for 26 days. 03/10/22 04/05/22  Burnadette PopAdhikari, Amrit, MD  ?ferrous sulfate 325 (65 FE) MG tablet Take 1 tablet (325 mg total) by mouth daily. 03/09/22 05/08/22  Burnadette Pop, MD  ?haloperidol (HALDOL) 0.5 MG tablet Take 0.5 mg by mouth every 8 (eight) hours as needed for agitation.    [provider]  ?HYDROcodone-acetaminophen (NORCO/VICODIN) 5-325 MG tablet Take 1 tablet by mouth every 6 (six) hours as needed for moderate pain. 03/09/22   Burnadette Pop, MD  ?mirtazapine (REMERON) 15 MG tablet Take 15 mg by mouth at bedtime.    [provider]  ?polyethylene glycol (MIRALAX / GLYCOLAX) 17 g packet Take 17 g by mouth daily. 03/10/22   Burnadette Pop, MD   ?Psyllium (METAMUCIL PO) Take 0.4 g by mouth in the morning and at bedtime.    [provider]  ?sennosides-docusate sodium (SENOKOT-S) 8.6-50 MG tablet Take 1 tablet by mouth See admin instructions. At bedtime on Monday,Wednesday and friday    [provider]  ?sertraline (ZOLOFT) 25 MG tablet Take 25 mg by mouth daily.    [provider]  ?sodium bicarbonate 650 MG tablet Take 1 tablet (650 mg total) by mouth 2 (two) times daily. 03/09/22   Burnadette Pop, MD  ?traZODone (DESYREL) 50 MG tablet Take 50 mg by mouth at bedtime.    [provider]  ?Vitamin D3 (VITAMIN D) 25 MCG tablet Take 1,000 Units by mouth daily.    [provider]  ? ? ?Physical Exam: ?Vitals:  ? 03/11/22 1606 03/11/22 1715 03/11/22 1730 03/11/22 1800  ?BP: 112/65 116/66 103/60 106/60  ?Pulse: 60 70 61 70  ?Resp: 20 14  16   ?Temp:      ?TempSrc:      ?SpO2: 100% 99% 97% 96%  ?Weight:      ?Height:      ? ? ?Physical Exam ?Constitutional:   ?   General: Jesse Cherry is not in acute distress. ?   Appearance: Normal appearance.  ?HENT:  ?   Head: Normocephalic and atraumatic.  ?   Mouth/Throat:  ?   Mouth: Mucous membranes are moist.  ?   Pharynx: Oropharynx is clear.  ?Eyes:  ?   Extraocular Movements: Extraocular movements intact.  ?   Pupils: Pupils are equal, round, and reactive to light.  ?Cardiovascular:  ?   Rate and Rhythm: Normal rate and regular rhythm.  ?   Pulses: Normal pulses.  ?   Heart sounds: Normal heart sounds.  ?Pulmonary:  ?   Effort: Pulmonary effort is normal. No respiratory distress.  ?   Breath sounds: Normal breath sounds.  ?Abdominal:  ?   General: Bowel sounds are normal. There is no distension.  ?   Palpations: Abdomen is soft.  ?   Tenderness: There is no abdominal tenderness.  ?Musculoskeletal:     ?   General: No swelling or deformity.  ?Skin: ?   General: Skin is warm and dry.  ?Neurological:  ?   General: No focal deficit present.  ?   Mental Status: Mental status is at baseline.   ?   Comments: Severely demented.  Says "ow "or "that is cold "when uncovered or being touched, says thank you when being covered back up  ? ?Labs on Admission: I have personally reviewed following labs and imaging studies ? ?CBC: ?Recent Labs  ?Lab 03/05/22 ?0124 03/05/22 ?0800 03/06/22 ?03/08/22 03/07/22 ?0153 03/08/22 ?0125 03/09/22 ?0157 03/11/22 ?1600  ?WBC 13.2*   < > 8.3 10.7* 9.9 9.2 12.1*  ?NEUTROABS 10.6*  --   --   --   --   --  8.6*  ?HGB 8.9*   < >  8.1* 8.9* 7.6* 7.7* 9.7*  ?HCT 29.7*   < > 26.2* 28.2* 24.0* 24.1* 30.0*  ?MCV 105.3*   < > 101.6* 98.9 97.2 98.0 97.7  ?PLT 220   < > 216 226 235 256 319  ? < > = values in this interval not displayed.  ? ? ?Basic Metabolic Panel: ?Recent Labs  ?Lab 03/06/22 ?1806 03/07/22 ?0153 03/08/22 ?0125 03/09/22 ?0157 03/11/22 ?1600  ?NA 140 139 138 139 138  ?K 5.3* 5.1 4.5 4.3 4.8  ?CL 114* 112* 112* 109 106  ?CO2 17* 19* 20* 24 22  ?GLUCOSE 115* 148* 93 79 99  ?BUN 40* 41* 47* 38* 38*  ?CREATININE 1.88* 1.94* 1.90* 1.77* 1.80*  ?CALCIUM 8.0* 7.9* 7.9* 7.9* 8.1*  ? ? ?GFR: ?Estimated Creatinine Clearance: 27.5 mL/min (A) (by C-G formula based on SCr of 1.8 mg/dL (H)). ? ?Liver Function Tests: ?Recent Labs  ?Lab 03/11/22 ?1600  ?AST 16  ?ALT 7  ?ALKPHOS 61  ?BILITOT 0.7  ?PROT 6.3*  ?ALBUMIN 2.7*  ? ? ?Urine analysis: ?   ?Component Value Date/Time  ? COLORURINE YELLOW 03/11/2022 1527  ? APPEARANCEUR HAZY (A) 03/11/2022 1527  ? LABSPEC 1.018 03/11/2022 1527  ? PHURINE 5.0 03/11/2022 1527  ? GLUCOSEU NEGATIVE 03/11/2022 1527  ? HGBUR MODERATE (A) 03/11/2022 1527  ? BILIRUBINUR NEGATIVE 03/11/2022 1527  ? KETONESUR NEGATIVE 03/11/2022 1527  ? PROTEINUR NEGATIVE 03/11/2022 1527  ? NITRITE NEGATIVE 03/11/2022 1527  ? LEUKOCYTESUR SMALL (A) 03/11/2022 1527  ? ? ?Radiological Exams on Admission: ?CT Head Wo Contrast ? ?Result Date: 03/11/2022 ?CLINICAL DATA:  Head trauma, fall EXAM: CT HEAD WITHOUT CONTRAST TECHNIQUE: Contiguous axial images were obtained from the base of the  skull through the vertex without intravenous contrast. RADIATION DOSE REDUCTION: This exam was performed according to the departmental dose-optimization program which includes automated exposure control, adjus

## 2022-03-11 NOTE — ED Notes (Signed)
Pt ambulated in room with unsteady gait and heavily assisted by two staff members. Pt was only able to take a few steps before having to sit down. PA notified.  ?

## 2022-03-11 NOTE — Progress Notes (Signed)
Transition of Care [facility]) - Emergency Department Mini Assessment ? ? ?Patient Details  ?Name: Jesse Cherry ?MRN: [id number] ?Date of Birth: 1933-06-28 ? ?Transition of Care (TOC) CM/SW Contact:    ?Lavenia Atlas, RN ?Phone Number: ?03/11/2022, 5:42 PM ? ? ?Clinical Narrative: ?Patient presented to [facility] ED via EMS from [facility] memory care unit, with c/o fall and right hip pain. RNCM received TOC consult for HHC PT at assisted living facility. ? ? ?ED Mini Assessment: ?  ?RNCM reviewed chart patient is a hospice patient with [facility]. Patient has had multiple falls recently.This RNCM spoke with [name] the oncall RN for [facility] to determine what services patient currently has available. Currently patient has chaplin, SW, RN with hospice. This RNCM advised the patient's daughter is requesting hospice PT. Per [facility] this is determined on an individual basis what services patient is eligible for. [facility] is aware of request and will contact this [facility] tomorrow with follow up. ? RNCM spoke with patient's daughter Arline Asp to advise of her request. This RNCM explained the patient is under hospice who will coordinate services. Arline Asp unable to complete conversation due to patient care while she's at bedside. Arline Asp will call RNCM back with any questions.  ? ?TOC will continue to follow  ?  ?Patient Contact and Communications ?  ? [name],[name] (Daughter)  ?[phone]   ?  ? ?Admission diagnosis:  Hip pain ?Patient Active Problem List  ? Diagnosis Date Noted  ? Hip fracture (HCC) 03/05/2022  ? Closed right hip fracture (HCC) 03/05/2022  ? ?PCP:  Bailey Mech, PA-C ?Pharmacy:   ?[id number] - [place], [place] - [address] ?[address] ?[place] [address] ?Phone: [phone] Fax: [phone] ? ?[id number] - [place], [place] - [address] [facility] [address] ?[address] ?[place] [address] ?Phone: [phone] Fax: [phone] ?  ?

## 2022-03-11 NOTE — Progress Notes (Signed)
[employer], [employer] [facility]) Hospital Liaison: RN note      ? ?This is a current [facility] hospice patient from [facility] memory care. His terminal diagnosis is protein calorie malnutrition.  ? ?[facility] hospital liaisons will follow for disposition and coordination of care. ? ?A Please do not hesitate to call with questions.    ?  ?Thank you,    ?  ?Elsie Saas, RN, CCM       ?[facility] Hospital Liaison    ?[phone]  ?

## 2022-03-11 NOTE — ED Notes (Signed)
Pt not in room.

## 2022-03-11 NOTE — ED Provider Notes (Signed)
?[facility] ?Provider Note ? ? ?CSN: [id number] ?Arrival date & time: 03/11/22  1310 ? ?  ? ?History ? ?Chief Complaint  ?Patient presents with  ?? Hip Pain  ? ? ?Jesse Cherry is a 88 y.o. male. ? ? ?Hip Pain ? ?88 year old male presents to the emergency department after a fall around 12 PM this afternoon at his assisted living facility [facility] unit.  He has been at this facility since February of this year.  He is accompanied by his daughter who is primary historian.  Daughter states that the fall was unwitnessed.  Patient was found screaming for help and was "scooting on the floor in the hallway."  Patient was just seen yesterday at [facility] after a fall and had a negative work-up.  He fell on 03/05/22 and had a right intertrochanteric fracture.  He then had an operation on 03/06/2022 with no immediate complications. Patient is currently taking Lovenox started yesterday.  Daughter denies change in mental status or baseline, fever, chills, night sweats, abnormal complaint/affect of father.   ?  ?Past medical history significant for chronic kidney disease, total left hip arthroplasty, total right hip arthroplasty, severe dementia.  ? ?Home Medications ?Prior to Admission medications   ?Medication Sig Start Date End Date Taking? Authorizing Provider  ?busPIRone (BUSPAR) 5 MG tablet Take 5 mg by mouth at bedtime.    [provider]  ?divalproex (DEPAKOTE) 125 MG DR tablet Take 125 mg by mouth 2 (two) times daily.    [provider]  ?enoxaparin (LOVENOX) 30 MG/0.3ML injection Inject 0.3 mLs (30 mg total) into the skin daily for 26 days. 03/10/22 04/05/22  Shelly Coss, MD  ?ferrous sulfate 325 (65 FE) MG tablet Take 1 tablet (325 mg total) by mouth daily. 03/09/22 05/08/22  Shelly Coss, MD  ?haloperidol (HALDOL) 0.5 MG tablet Take 0.5 mg by mouth every 8 (eight) hours as needed for agitation.    [provider]   ?HYDROcodone-acetaminophen (NORCO/VICODIN) 5-325 MG tablet Take 1 tablet by mouth every 6 (six) hours as needed for moderate pain. 03/09/22   Shelly Coss, MD  ?mirtazapine (REMERON) 15 MG tablet Take 15 mg by mouth at bedtime.    [provider]  ?polyethylene glycol (MIRALAX / GLYCOLAX) 17 g packet Take 17 g by mouth daily. 03/10/22   Shelly Coss, MD  ?Psyllium (METAMUCIL PO) Take 0.4 g by mouth in the morning and at bedtime.    [provider]  ?sennosides-docusate sodium (SENOKOT-S) 8.6-50 MG tablet Take 1 tablet by mouth See admin instructions. At bedtime on Monday,Wednesday and friday    [provider]  ?sertraline (ZOLOFT) 25 MG tablet Take 25 mg by mouth daily.    [provider]  ?sodium bicarbonate 650 MG tablet Take 1 tablet (650 mg total) by mouth 2 (two) times daily. 03/09/22   Shelly Coss, MD  ?traZODone (DESYREL) 50 MG tablet Take 50 mg by mouth at bedtime.    [provider]  ?Vitamin D3 (VITAMIN D) 25 MCG tablet Take 1,000 Units by mouth daily.    [provider]  ?   ? ?Allergies    ?Demerol [meperidine hcl]   ? ?Review of Systems   ?Review of Systems  ?Reason unable to perform ROS: Limited secondary patient's baseline demented status.  Review of systems given by daughter who is at bedside.  ? ?Physical Exam ?Updated Vital Signs ?BP 106/60   Pulse 70  Temp 97.9 ?F (36.6 ?C) (Oral)   Resp 16   Ht 5\' 7"  (1.702 m)   Wt 74.7 kg   SpO2 96%   BMI 25.79 kg/m?  ?Physical Exam ?Vitals and nursing note reviewed.  ?Constitutional:   ?   General: He is sleeping. He is not in acute distress. ?   Appearance: He is not ill-appearing, toxic-appearing or diaphoretic.  ?   Comments: Patient is hypersomnolent in the bed.  Difficult to ascertain what exactly is hurting the patient.  He responds to any light touch or movement of any body part with repetitively stating "ow."  No increase in verbal response to pain with moving affected extremity.   Patient not responsive to verbal commands.  He is very hard of hearing.  ?HENT:  ?   Head: Normocephalic and atraumatic.  ?   Comments: No observable deformity noted on scalp.  No lesions, ecchymosis, areas of erythema noted. ?   Right Ear: Tympanic membrane normal.  ?   Left Ear: Tympanic membrane normal.  ?   Nose: Nose normal.  ?   Mouth/Throat:  ?   Mouth: Mucous membranes are moist.  ?   Pharynx: Oropharynx is clear.  ?Eyes:  ?   Extraocular Movements: Extraocular movements intact.  ?   Conjunctiva/sclera: Conjunctivae normal.  ?   Pupils: Pupils are equal, round, and reactive to light.  ?Cardiovascular:  ?   Rate and Rhythm: Normal rate and regular rhythm.  ?   Pulses: Normal pulses.  ?   Heart sounds: Normal heart sounds.  ?Pulmonary:  ?   Effort: Pulmonary effort is normal.  ?   Breath sounds: Normal breath sounds.  ?Abdominal:  ?   General: Abdomen is flat. Bowel sounds are normal. There is no distension.  ?   Palpations: Abdomen is soft. There is no mass.  ?   Tenderness: There is no guarding.  ?   Hernia: No hernia is present.  ?   Comments: Abdominal exam relatively benign.  Patient continuing to repeat "ow" as noted throughout the rest of the exam.  No overlying skin out of that abnormalities including erythema, ecchymosis, areas of induration/fluctuance/lesions.  ?Musculoskeletal:     ?   General: No swelling. Normal range of motion.  ?   Cervical back: Normal range of motion and neck supple.  ?   Comments: No noted increase in "ow" response when palpating C-spine, thoracic spine, lumbar spine, bilateral upper extremities as well as bilateral lower extremities.  No observable skin abnormalities noted on chest wall, upper extremities, back, lower extremities.  Unable to assess patient's strength, potential sensory deficits.  Patient has new palpable pulses in radial and posterior tibial arteries.  Skin overlying right femur incision is clean, dry, non-erythematic, and not draining.  ?Skin: ?   General:  Skin is warm and dry.  ?   Capillary Refill: Capillary refill takes less than 2 seconds.  ?Neurological:  ?   Mental Status: He is easily aroused. Mental status is at baseline. He is lethargic.  ?   Comments: Patient is at baseline as per daughter but is unaware of where he is at or why he is here.  He is aware of his name.  Neurologic exam extremely limited secondary to lack of patient understanding verbal commands.  Gait, weakness, sensory abnormalities difficult to assess.  ? ? ?ED Results / Procedures / Treatments   ?Labs ?(all labs ordered are listed, but only abnormal results are displayed) ?Labs Reviewed  ?  COMPREHENSIVE METABOLIC PANEL - Abnormal; Notable for the following components:  ?    Result Value  ? BUN 38 (*)   ? Creatinine, Ser 1.80 (*)   ? Calcium 8.1 (*)   ? Total Protein 6.3 (*)   ? Albumin 2.7 (*)   ? GFR, Estimated 36 (*)   ? All other components within normal limits  ?CBC WITH DIFFERENTIAL/PLATELET - Abnormal; Notable for the following components:  ? WBC 12.1 (*)   ? RBC 3.07 (*)   ? Hemoglobin 9.7 (*)   ? HCT 30.0 (*)   ? Neutro Abs 8.6 (*)   ? Abs Immature Granulocytes 0.33 (*)   ? All other components within normal limits  ?URINALYSIS, ROUTINE W REFLEX MICROSCOPIC - Abnormal; Notable for the following components:  ? APPearance HAZY (*)   ? Hgb urine dipstick MODERATE (*)   ? Leukocytes,Ua SMALL (*)   ? Bacteria, UA FEW (*)   ? All other components within normal limits  ?URINE CULTURE  ? ? ?EKG ?None ? ?Radiology ?CT Head Wo Contrast ? ?Result Date: 03/11/2022 ?CLINICAL DATA:  Head trauma, fall EXAM: CT HEAD WITHOUT CONTRAST TECHNIQUE: Contiguous axial images were obtained from the base of the skull through the vertex without intravenous contrast. RADIATION DOSE REDUCTION: This exam was performed according to the departmental dose-optimization program which includes automated exposure control, adjustment of the mA and/or kV according to patient size and/or use of iterative reconstruction  technique. COMPARISON:  CT head 03/05/2022 FINDINGS: Brain: No acute intracranial hemorrhage, mass effect, or herniation. No extra-axial fluid collections. No evidence of acute territorial infarct. No hydrocephalus. Moderate

## 2022-03-12 ENCOUNTER — Other Ambulatory Visit: Payer: Self-pay

## 2022-03-12 DIAGNOSIS — F0390 Unspecified dementia without behavioral disturbance: Secondary | ICD-10-CM | POA: Diagnosis not present

## 2022-03-12 DIAGNOSIS — N3001 Acute cystitis with hematuria: Secondary | ICD-10-CM

## 2022-03-12 DIAGNOSIS — N183 Chronic kidney disease, stage 3 unspecified: Secondary | ICD-10-CM | POA: Diagnosis not present

## 2022-03-12 DIAGNOSIS — D631 Anemia in chronic kidney disease: Secondary | ICD-10-CM | POA: Diagnosis not present

## 2022-03-12 LAB — COMPREHENSIVE METABOLIC PANEL
ALT: 8 U/L (ref 0–44)
AST: 12 U/L — ABNORMAL LOW (ref 15–41)
Albumin: 2.4 g/dL — ABNORMAL LOW (ref 3.5–5.0)
Alkaline Phosphatase: 58 U/L (ref 38–126)
Anion gap: 7 (ref 5–15)
BUN: 36 mg/dL — ABNORMAL HIGH (ref 8–23)
CO2: 23 mmol/L (ref 22–32)
Calcium: 8.1 mg/dL — ABNORMAL LOW (ref 8.9–10.3)
Chloride: 110 mmol/L (ref 98–111)
Creatinine, Ser: 1.82 mg/dL — ABNORMAL HIGH (ref 0.61–1.24)
GFR, Estimated: 36 mL/min — ABNORMAL LOW (ref >60)
Glucose, Bld: 77 mg/dL (ref 70–99)
Potassium: 4.4 mmol/L (ref 3.5–5.1)
Sodium: 140 mmol/L (ref 135–145)
Total Bilirubin: 0.6 mg/dL (ref 0.3–1.2)
Total Protein: 5.9 g/dL — ABNORMAL LOW (ref 6.5–8.1)

## 2022-03-12 LAB — CBC
HCT: 27.5 % — ABNORMAL LOW (ref 39.0–52.0)
Hemoglobin: 8.9 g/dL — ABNORMAL LOW (ref 13.0–17.0)
MCH: 31.4 pg (ref 26.0–34.0)
MCHC: 32.4 g/dL (ref 30.0–36.0)
MCV: 97.2 fL (ref 80.0–100.0)
Platelets: 328 10*3/uL (ref 150–400)
RBC: 2.83 MIL/uL — ABNORMAL LOW (ref 4.22–5.81)
RDW: 14.8 % (ref 11.5–15.5)
WBC: 11.1 10*3/uL — ABNORMAL HIGH (ref 4.0–10.5)
nRBC: 0 % (ref 0.0–0.2)

## 2022-03-12 LAB — URINE CULTURE

## 2022-03-12 NOTE — Consult Note (Signed)
?ORTHOPAEDIC CONSULTATION ? ?REQUESTING PHYSICIAN: Bonnielee Haff, MD ? ?Chief Complaint: Fall  ? ?HPI: ?Jesse Cherry is a 88 y.o. male with history of CKD, anemia, anxiety, BPH, neuropathy, and dementia who recently underwent a right hip hemiarthroplasty with Dr. Zachery Dakins on 4/20 who had a new fall at his memory care unit yesterday.  According to family fall was unwitnessed.  He also had a fall on 4/24 and was evaluated at [facility] long ED with a negative work-up.  He has been residing at his regional memory care unit of her family does not think he has had any PT or OT at the facility.  Family feels that he is weak and starts to walk when he gets confused.  He is unable to communicate pain to me today. ? ?Past Medical History:  ?Diagnosis Date  ? Chronic kidney disease   ? Dementia ([place])   ? ?Past Surgical History:  ?Procedure Laterality Date  ? CHOLECYSTECTOMY    ? HIP ARTHROPLASTY Right 03/06/2022  ? Procedure: ARTHROPLASTY BIPOLAR HIP (HEMIARTHROPLASTY);  Surgeon: Willaim Sheng, MD;  Location: [facility];  Service: Orthopedics;  Laterality: Right;  ? TOTAL HIP ARTHROPLASTY Left   ? ?Social History  ? ?Socioeconomic History  ? Marital status: Widowed  ?  Spouse name: Not on file  ? Number of children: Not on file  ? Years of education: Not on file  ? Highest education level: Not on file  ?Occupational History  ? Not on file  ?Tobacco Use  ? Smoking status: Never  ? Smokeless tobacco: Never  ?Substance and Sexual Activity  ? Alcohol use: Never  ? Drug use: Never  ? Sexual activity: Not on file  ?Other Topics Concern  ? Not on file  ?Social History Narrative  ? Not on file  ? ?Social Determinants of Health  ? ?Financial Resource Strain: Not on file  ?Food Insecurity: Not on file  ?Transportation Needs: Not on file  ?Physical Activity: Not on file  ?Stress: Not on file  ?Social Connections: Not on file  ? ?History reviewed. No pertinent family history. ?Allergies  ?Allergen Reactions  ? Demerol [Meperidine  Hcl]   ? ? ? ?Positive ROS: All other systems have been reviewed and were otherwise negative with the exception of those mentioned in the HPI and as above. ? ?Physical Exam: ?General: Somnolent on exam, in no acute distress ?Cardiovascular: No pedal edema ?Respiratory: No cyanosis, no use of accessory musculature ?GI: No organomegaly, abdomen is soft and non-tender ?Skin: No lesions in the area of chief complaint ?Neurologic: Unable to assess sensation. ?Psychiatric: Patient states "ow" though not associated with any touch or movement, son-in-law states this is normal for him, and complains that he needs to urinate.  ?Lymphatic: No axillary or cervical lymphadenopathy ? ?MUSCULOSKELETAL: RLE -EHL and FHL intact.  Able to flex and extend all toes.  Unable to assess sensation.  2+ DP pulse.  Tolerates approximately 70 degrees of forward flexion at the right hip without indicating pain.   ? ?Imaging: CT of right hip ?Right hip arthroplasty changes with cerclage wires in place. A ?slightly comminuted fracture of the greater trochanter is noted with stable alignment as compared with prior exams. ? ?Assessment: ?2 falls after recent right hip hemiarthroplasty/ORIF ?-Patient has slightly comminuted greater trochanter fracture when compared to prior imaging.  Stem appears stable.  I am okay for him to weight-bear as tolerated when working with physical therapy.  ?- PT, OT eval today ?-Son-in-law indicated that they would  like to pursue another rehab option as he does not feel that he has been getting adequate steroid therapy at his memory care center.  TOC on board for placement. ? ?Ventura Bruns, PA-C ? ? ? ?03/12/2022 ?8:41 AM ?  ?

## 2022-03-12 NOTE — Progress Notes (Addendum)
MC 5N14 Civil engineer, contracting [facility]) Hospital Liaison: RN note    ?  ?This is a current [facility] hospice patient from [facility] care with a terminal diagnosis of protein calorie malnutrition. He was transported to ED for evaluation following a fall. He  was admitted to the hospital on 4/25 for pain management and evaluation of previous femur fracture. Per Dr. Kirt Boys this is a related hospital admission.  ? ?This patient is appropriate for inpatient hospice care for symptom management, IV antibiotics and safe discharge plan.  ? ?Visited at bedside with SIL present. Patient appears comfortable. Sleeping but did wake to voice. He is very HOH. PT evaluated right before my arrival. Family is considering rehab.  ? ?VS: 98.6, 111/69, 67, 15, 95% RA ?I/O: 49/300 ? ?Abnormal labs: ?03/12/22 02:25 ?BUN: 36 (H) ?Creatinine: 1.82 (H) ?Calcium: 8.1 (L) ?Alkaline Phosphatase: 58 ?Albumin: 2.4 (L) ?AST: 12 (L) ?Total Protein: 5.9 (L) ?GFR, Estimated: 36 (L) ?WBC: 11.1 (H) ?RBC: 2.83 (L) ?Hemoglobin: 8.9 (L) ?HCT: 27.5 (L) ? ?Diagnostics: ?Hip xray ?IMPRESSION: ?Right hip arthroplasty changes with cerclage wires in place. A ?slightly comminuted fracture of the greater trochanter is noted with ?stable alignment as compared with prior exams. ? ?IV/PRN medications: Rocephin 1g IVPB QD ? ? ?Problem list: ?Recurrent falls ?Patient has had numerous falls at his assisted living facility recently.  PT and OT evaluation.  Will likely need to go to skilled nursing facility.  UTI may have contributed. ?  ?Acute UTI ?UA was noted to be abnormal.  Started on ceftriaxone.  Follow-up on cultures.  May have contributed to his falls. ?  ?Recent hip fracture ?Underwent right hip hemiarthroplasty earlier this month.  Initial imaging studies raise concern for displacement.  CT was done.  Orthopedics was consulted.  They have reviewed the CT report.  No intervention is recommended.  Weight-bear as tolerated.  PT and OT evaluation.  Pain  control. ?  ?Anxiety ?Dementia ?Episodes of agitation have been noted.  Has required safety sitter previously.  Noted to be on multiple psychotropic agents including BuSpar, Depakote, Remeron, Zoloft, trazodone.  Polypharmacy could be contributing. ? ?CKD3b ?Renal function at baseline.  Monitor urine output.  Continue sodium bicarbonate. ?  ?Anemia of chronic kidney disease ?Stable hemoglobin noted.  Noted to be on iron supplements which is being continued. ?  ?Discharge planning: ongoing, may need to go to SNF for rehab.  ?Family contact: Spoke with  SIL in room and dtr by phone.  ?IDG: updated ?GOC: DNR  ?  ?Should patient need ambulance transport at discharge please use [facility] as [facility] contracts this service with them for our active hospice patients. ?  ?A Please do not hesitate to call with questions.    ?  ?Thank you,    ?  ?Elsie Saas, RN, CCM       ?[facility] Hospital Liaison    ?[phone]  ?  ? ? ? ?

## 2022-03-12 NOTE — ED Notes (Signed)
Breakfast order placed ?

## 2022-03-12 NOTE — Progress Notes (Signed)
Pt arrived to the unit. Family member is at the bedside. Pt is alert only to himself. Call bell is within reach. Bed alarm is on, the bed is on the lowest position, floor mats are placed. Pt's family member Hessie Diener is requesting a Recruitment consultant just for the night time. VS WDL ?

## 2022-03-12 NOTE — Progress Notes (Signed)
This RNCM received call back from Leoti. RNCM with [facility] who stated the inpatient [facility] coordinator will follow patient. Per Melia, there is a potential for home PT while in hospice care, however reviewed on an individual basis to assist with the quality of life.  ?

## 2022-03-12 NOTE — Progress Notes (Signed)
CNA is at beside sitting with pt.  ?

## 2022-03-12 NOTE — Progress Notes (Signed)
? ?[facility] ?PROGRESS NOTE ? ? ?Jesse Cherry [id number] DOB: [birth year] DOA: 03/11/2022 ? ?PCP: Jonathon Bellows, PA-C ? ?Brief History/Interval Summary: 88 y.o. male with medical history significant of hip fracture, dementia, CKD, anemia, anxiety, BPH, neuropathy presented after a fall at his memory care unit. History provided with assistance of daughter and chart review due to patient's baseline dementia.  Apparently has had multiple falls recently.  Recently hospitalized for hip fracture after another fall.  He underwent surgical intervention.  Found to have a urinary tract infection.  Imaging studies of the head raise concern for displacement.  Orthopedics was consulted.   ? ?Consultants: Orthopedics ? ?Procedures: None ? ? ?Subjective/Interval History: ?Patient lying comfortably on the bed.  Confused.  No family at bedside. ? ? ? ?Assessment/Plan: ? ?Recurrent falls ?Patient has had numerous falls at his assisted living facility recently.  PT and OT evaluation.  Will likely need to go to skilled nursing facility.  UTI may have contributed. ? ?Acute UTI ?UA was noted to be abnormal.  Started on ceftriaxone.  Follow-up on cultures.  May have contributed to his falls. ? ?Recent hip fracture ?Underwent right hip hemiarthroplasty earlier this month.  Initial imaging studies raise concern for displacement.  CT was done.  Orthopedics was consulted.  They have reviewed the CT report.  No intervention is recommended.  Weight-bear as tolerated.  PT and OT evaluation.  Pain control. ?  ?Anxiety ?Dementia ?Episodes of agitation have been noted.  Has required safety sitter previously.  Noted to be on multiple psychotropic agents including BuSpar, Depakote, Remeron, Zoloft, trazodone.  Polypharmacy could be contributing. ? ?CKD3b ?Renal function at baseline.  Monitor urine output.  Continue sodium bicarbonate. ?  ?Anemia of chronic kidney disease ?Stable hemoglobin noted.  Noted to be on iron  supplements which is being continued. ? ? ?DVT Prophylaxis: Lovenox ?Code Status: DNR ?Family Communication: No family at bedside ?Disposition Plan: SNF ? ?Status is: Observation ?The patient will require care spanning > 2 midnights and should be moved to inpatient because: Frequent falls, unsafe discharge ? ? ? ? ? ?Medications: Scheduled: ? busPIRone  5 mg Oral QHS  ? divalproex  125 mg Oral BID  ? enoxaparin (LOVENOX) injection  30 mg Subcutaneous Q24H  ? ferrous sulfate  325 mg Oral Daily  ? mirtazapine  15 mg Oral QHS  ? sertraline  25 mg Oral Daily  ? sodium bicarbonate  650 mg Oral BID  ? sodium chloride flush  3 mL Intravenous Q12H  ? traZODone  50 mg Oral QHS  ? ?Continuous: ? cefTRIAXone (ROCEPHIN)  IV Stopped (03/11/22 2144)  ? ?[id number] **OR** acetaminophen, haloperidol, polyethylene glycol ? ?Antibiotics: ?Anti-infectives (From admission, onward)  ? ? Start     Dose/Rate Route Frequency Ordered Stop  ? 03/11/22 2000  cefTRIAXone (ROCEPHIN) 1 g in sodium chloride 0.9 % 100 mL IVPB       ? 1 g ?200 mL/hr over 30 Minutes Intravenous Every 24 hours 03/11/22 1935    ? 03/11/22 1815  sulfamethoxazole-trimethoprim (BACTRIM DS) 800-160 MG per tablet 1 tablet       ? 1 tablet Oral  Once 03/11/22 1807 03/11/22 1827  ? ?  ? ? ?Objective: ? ?Vital Signs ? ?Vitals:  ? 03/12/22 0500 03/12/22 0827 03/12/22 1015 03/12/22 1045  ?BP: 122/73 111/69 105/64 100/65  ?Pulse: 70 67 66 71  ?Resp: 18 15 15 13   ?Temp:  98.6 ?F (37 ?C)    ?TempSrc:  Oral    ?  SpO2: 97% 95% 98% 97%  ?Weight:      ?Height:      ? ? ?Intake/Output Summary (Last 24 hours) at 03/12/2022 1145 ?Last data filed at 03/12/2022 0200 ?Gross per 24 hour  ?Intake 49 ml  ?Output 300 ml  ?Net -251 ml  ? ?Filed Weights  ? 03/11/22 1323  ?Weight: 74.7 kg  ? ? ?General appearance: Awake alert.  Confused ?Resp: Clear to auscultation bilaterally.  Normal effort ?Cardio: S1-S2 is normal regular.  No S3-S4.  No rubs murmurs or bruit ?GI: Abdomen is soft.   Nontender nondistended.  Bowel sounds are present normal.  No masses organomegaly ?Extremities: No edema.   ?Neurologic:  No focal neurological deficits.  ? ? ?Lab Results: ? ?Data Reviewed: I have personally reviewed following labs and reports of the imaging studies ? ?CBC: ?Recent Labs  ?Lab 03/07/22 ?0153 03/08/22 ?0125 03/09/22 ?0157 03/11/22 ?1600 03/12/22 ?0225  ?WBC 10.7* 9.9 9.2 12.1* 11.1*  ?NEUTROABS  --   --   --  8.6*  --   ?HGB 8.9* 7.6* 7.7* 9.7* 8.9*  ?HCT 28.2* 24.0* 24.1* 30.0* 27.5*  ?MCV 98.9 97.2 98.0 97.7 97.2  ?PLT 226 235 256 319 328  ? ? ?Basic Metabolic Panel: ?Recent Labs  ?Lab 03/07/22 ?0153 03/08/22 ?0125 03/09/22 ?0157 03/11/22 ?1600 03/12/22 ?0225  ?NA 139 138 139 138 140  ?K 5.1 4.5 4.3 4.8 4.4  ?CL 112* 112* 109 106 110  ?CO2 19* 20* 24 22 23   ?GLUCOSE 148* 93 79 99 77  ?BUN 41* 47* 38* 38* 36*  ?CREATININE 1.94* 1.90* 1.77* 1.80* 1.82*  ?CALCIUM 7.9* 7.9* 7.9* 8.1* 8.1*  ? ? ?GFR: ?Estimated Creatinine Clearance: 27.2 mL/min (A) (by C-G formula based on SCr of 1.82 mg/dL (H)). ? ?Liver Function Tests: ?Recent Labs  ?Lab 03/11/22 ?1600 03/12/22 ?0225  ?AST 16 12*  ?ALT 7 8  ?ALKPHOS 61 58  ?BILITOT 0.7 0.6  ?PROT 6.3* 5.9*  ?ALBUMIN 2.7* 2.4*  ? ? ?Recent Results (from the past 240 hour(s))  ?MRSA Next Gen by PCR, Nasal     Status: None  ? Collection Time: 03/05/22  7:10 PM  ? Specimen: Nasal Mucosa; Nasal Swab  ?Result Value Ref Range Status  ? MRSA by PCR Next Gen NOT DETECTED NOT DETECTED Final  ?  Comment: (NOTE) ?The GeneXpert MRSA Assay (FDA approved for NASAL specimens only), ?is one component of a comprehensive MRSA colonization surveillance ?program. It is not intended to diagnose MRSA infection nor to guide ?or monitor treatment for MRSA infections. ?Test performance is not FDA approved in patients less than 2 years ?old. ?Performed at [facility], [place] [address], [place], [place] ?[address] ?  ?Culture, blood (routine x 2)     Status: None (Preliminary result)  ?  Collection Time: 03/11/22  6:54 PM  ? Specimen: BLOOD  ?Result Value Ref Range Status  ? Specimen Description BLOOD SITE NOT SPECIFIED  Final  ? Special Requests   Final  ?  BOTTLES DRAWN AEROBIC AND ANAEROBIC Blood Culture results may not be optimal due to an inadequate volume of blood received in culture bottles  ? Culture   Final  ?  NO GROWTH < 12 HOURS ?Performed at [facility], [place] [address] [place], [place] [address] ?  ? Report Status PENDING  Incomplete  ?  ? ? ?Radiology Studies: ?CT Head Wo Contrast ? ?Result Date: 03/11/2022 ?CLINICAL DATA:  Head trauma, fall EXAM: CT HEAD  WITHOUT CONTRAST TECHNIQUE: Contiguous axial images were obtained from the base of the skull through the vertex without intravenous contrast. RADIATION DOSE REDUCTION: This exam was performed according to the departmental dose-optimization program which includes automated exposure control, adjustment of the mA and/or kV according to patient size and/or use of iterative reconstruction technique. COMPARISON:  CT head 03/05/2022 FINDINGS: Brain: No acute intracranial hemorrhage, mass effect, or herniation. No extra-axial fluid collections. No evidence of acute territorial infarct. No hydrocephalus. Moderate cortical volume loss. Patchy hypodensities in the periventricular and subcortical white matter, likely secondary to chronic microvascular ischemic changes. Vascular: No hyperdense vessel or unexpected calcification. Skull: Normal. Negative for fracture or focal lesion. Sinuses/Orbits: No acute process identified. Mild mucosal thickening in the right sphenoid sinus. Other: None. IMPRESSION: Chronic changes with no acute intracranial process identified. Electronically Signed   By: Ofilia Neas M.D.   On: 03/11/2022 16:03  ? ?CT Cervical Spine Wo Contrast ? ?Result Date: 03/11/2022 ?CLINICAL DATA:  Neck trauma EXAM: CT CERVICAL SPINE WITHOUT CONTRAST TECHNIQUE: Multidetector CT imaging of the cervical spine was performed  without intravenous contrast. Multiplanar CT image reconstructions were also generated. RADIATION DOSE REDUCTION: This exam was performed according to the departmental dose-optimization program which includes automated expos

## 2022-03-12 NOTE — Evaluation (Signed)
Occupational Therapy Evaluation ?Patient Details ?Name: Jesse Cherry ?MRN: [id number] ?DOB: 02-Jan-1933 ?Today's Date: 03/12/2022 ? ? ?History of Present Illness Pt is an 88 y.o. male presenting to [facility] ED 03/11/22 after sustaining falls at his memory care unit and has progressive weakness. Recent admission to [facility] long ED. Imaging (+) for comminuted fx of the R greater trochanter with orthopedic surgeon clearing pt for mobility, WBAT. PMH includes advanced dementia, CKD, L THA, and recent R hemiarthroplasty (03/06/22).  ? ?Clinical Impression ?  ?Pt admitted for above and presents with problem list below, including generalized weakness, impaired balance, pain and decreased activity tolerance.  Pt with hx of dementia, oriented to self and follows simple commands with increased time.  Patient currently requires min-mod assist +2 for bed mobility, min assist +2 for transfers and side stepping at EOB and max-total assist +2 for ADLs.  Patient will benefit from continued OT services while admitted and after dc at SNF level to decrease burden of care and increase safety for ADLs and mobility.  Will follow acutely.    ?   ? ?Recommendations for follow up therapy are one component of a multi-disciplinary discharge planning process, led by the attending physician.  Recommendations may be updated based on patient status, additional functional criteria and insurance authorization.  ? ?Follow Up Recommendations ? Skilled nursing-short term rehab (<3 hours/day)  ?  ?Assistance Recommended at Discharge Frequent or constant Supervision/Assistance  ?Patient can return home with the following A lot of help with walking and/or transfers;A lot of help with bathing/dressing/bathroom;Assistance with cooking/housework;Direct supervision/assist for medications management;Help with stairs or ramp for entrance;Assist for transportation;Direct supervision/assist for financial management ? ?  ?Functional Status Assessment ? Patient has had a  recent decline in their functional status and demonstrates the ability to make significant improvements in function in a reasonable and predictable amount of time.  ?Equipment Recommendations ? Other (comment) (defer)  ?  ?Recommendations for Other Services   ? ? ?  ?Precautions / Restrictions Precautions ?Precautions: Fall ?Restrictions ?Weight Bearing Restrictions: Yes ?RLE Weight Bearing: Weight bearing as tolerated  ? ?  ? ?Mobility Bed Mobility ?Overal bed mobility: Needs Assistance ?Bed Mobility: Supine to Sit, Sit to Supine ?  ?  ?Supine to sit: +2 for physical assistance, +2 for safety/equipment, Mod assist ?Sit to supine: Min assist, +2 for physical assistance, +2 for safety/equipment ?  ?General bed mobility comments: mod Ax2 for trunk elevation and LE management to sit at EOB. Pt requiring max cuing with simple commands with tasks. Pt min Ax2 for trunk and LE management with return to supine. ?  ? ?Transfers ?Overall transfer level: Needs assistance ?Equipment used: 2 person hand held assist ?Transfers: Sit to/from Stand ?Sit to Stand: Min assist, +2 safety/equipment, +2 physical assistance ?  ?  ?  ?  ?  ?General transfer comment: min assist +2 to power up and steady from EOB, requires cueing for safety, sequencing ?  ? ?  ?Balance Overall balance assessment: Needs assistance ?Sitting-balance support: Feet unsupported, Single extremity supported ?Sitting balance-Leahy Scale: Fair ?Sitting balance - Comments: min guard for safety and balance ?  ?Standing balance support: Bilateral upper extremity supported, During functional activity ?Standing balance-Leahy Scale: Poor ?Standing balance comment: reliant on BUE support and external assist ?  ?  ?  ?  ?  ?  ?  ?  ?  ?  ?  ?   ? ?ADL either performed or assessed with clinical judgement  ? ?ADL Overall ADL's :  Needs assistance/impaired ?  ?  ?Grooming: Maximal assistance;Sitting ?  ?  ?  ?  ?  ?Upper Body Dressing : Maximal assistance;Sitting ?  ?Lower Body  Dressing: Total assistance;+2 for physical assistance;+2 for safety/equipment;Sit to/from stand ?  ?Toilet Transfer: Minimal assistance;+2 for safety/equipment;+2 for physical assistance ?Toilet Transfer Details (indicate cue type and reason): simulated side stepping at EOB ?  ?  ?  ?  ?Functional mobility during ADLs: Minimal assistance;+2 for physical assistance;+2 for safety/equipment ?   ? ? ? ?Vision   ?Vision Assessment?: No apparent visual deficits  ?   ?Perception   ?  ?Praxis   ?  ? ?Pertinent Vitals/Pain Pain Assessment ?Pain Assessment: Faces ?Faces Pain Scale: Hurts little more ?Pain Location: R hip with movement ?Pain Descriptors / Indicators: Discomfort, Grimacing, Guarding, Moaning ?Pain Intervention(s): Limited activity within patient's tolerance, Monitored during session, Repositioned  ? ? ? ?Hand Dominance   ?  ?Extremity/Trunk Assessment Upper Extremity Assessment ?Upper Extremity Assessment: Overall WFL for tasks assessed ?  ?Lower Extremity Assessment ?Lower Extremity Assessment: Defer to PT evaluation ?RLE Deficits / Details: recent R hip hemiarthoplasty w/ ORIF (03/06/22); difficult to assess strength due to advanced nature of dementia and pt's HOH. Pt with decreased muscle mass b/l LE ?RLE Coordination: decreased gross motor ?  ?Cervical / Trunk Assessment ?Cervical / Trunk Assessment: Kyphotic ?  ?Communication Communication ?Communication: HOH;Other (comment) (hears better out of L ear. Perseverates "I need help" and "oh god bless *relatives name*". Son in law stating pt calling out dead relatives names repeatedly) ?  ?Cognition Arousal/Alertness: Awake/alert ?Behavior During Therapy: [facility] for tasks assessed/performed, Anxious ?Overall Cognitive Status: History of cognitive impairments - at baseline ?  ?  ?  ?  ?  ?  ?  ?  ?  ?  ?  ?  ?  ?  ?  ?  ?General Comments: pt with dementia, follows simple commands with increased time but demonstrates decreased attention and requires repetition ?  ?   ?General Comments  pt's son in law, Freida Busman, present ? ?  ?Exercises   ?  ?Shoulder Instructions    ? ? ?Home Living Family/patient expects to be discharged to:: Assisted living ?  ?  ?  ?  ?  ?  ?  ?  ?  ?  ?  ?  ?  ?  ?  ?  ?Additional Comments: pt resident at [facility]. ?  ? ?  ?Prior Functioning/Environment Prior Level of Function : Needs assist;History of Falls (last six months) ? Cognitive Assist : Mobility (cognitive);ADLs (cognitive) ?Mobility (Cognitive): Intermittent cues ?ADLs (Cognitive): Step by step cues ?Physical Assist : Mobility (physical);ADLs (physical) ?Mobility (physical): Bed mobility;Transfers;Gait ?ADLs (physical): Bathing;Dressing;Toileting;IADLs;Grooming ?Mobility Comments: staff assist with mobility after R hip ORIF. Mostly using wheelchair now ?ADLs Comments: Staff assists with all ADL/iADLs as needed ?  ? ?  ?  ?OT Problem List: Decreased strength;Decreased activity tolerance;Impaired balance (sitting and/or standing);Decreased cognition;Decreased safety awareness;Decreased knowledge of use of DME or AE;Decreased knowledge of precautions;Pain ?  ?   ?OT Treatment/Interventions: Self-care/ADL training;Therapeutic exercise;DME and/or AE instruction;Therapeutic activities;Patient/family education;Balance training  ?  ?OT Goals(Current goals can be found in the care plan section) Acute Rehab OT Goals ?Patient Stated Goal: not stated ?Time For Goal Achievement: 03/26/22 ?Potential to Achieve Goals: Good  ?OT Frequency: Min 2X/week ?  ? ?Co-evaluation PT/OT/SLP Co-Evaluation/Treatment: Yes ?Reason for Co-Treatment: Complexity of the patient's impairments (multi-system involvement);Necessary to address cognition/behavior during functional  activity;For patient/therapist safety;To address functional/ADL transfers ?PT goals addressed during session: Mobility/safety with mobility;Balance;Strengthening/ROM ?OT goals addressed during session: ADL's and self-care ?  ? ?  ?AM-PAC OT  "6 Clicks" Daily Activity     ?Outcome Measure Help from another person eating meals?: A Little ?Help from another person taking care of personal grooming?: A Lot ?Help from another person toileting, wh

## 2022-03-12 NOTE — Evaluation (Signed)
Physical Therapy Evaluation ?Patient Details ?Name: Jesse Cherry ?MRN: [id number] ?DOB: 05-Feb-1933 ?Today's Date: 03/12/2022 ? ?History of Present Illness ? Pt is an 88 y.o. male presenting to [facility] ED 03/11/22 after sustaining falls at his memory care unit and has progressive weakness. Recent admission to [facility] long ED. Imaging (+) for comminuted fx of the R greater trochanter with orthopedic surgeon clearing pt for mobility, WBAT. PMH includes advanced dementia, CKD, L THA, and recent R hemiarthroplasty (03/06/22).  ?Clinical Impression ? Patient presented to the [facility] ED on 03/11/22 after sustaining falls at his memory care ALF unit after recent R hip ORIF on 4/20. Pt's impairments include decreased cognition, range of motion, strength, coordination, and balance. These impairments are limiting his ability to safely perform bed mobility, transfer, and ambulate. Patient requires min Ax2 to mod Ax2 for bed mobility, and min Ax2 for transfers and side steps at EOB using 2 person HHA. Pt requiring max cues with simple commands. Pt perseverates during session constantly. SPT recommending SNF upon D/C due to mobility deficits. PT will continue to follow acutely to maximize pt's safety and independence with functional mobility. ?   ?   ? ?Recommendations for follow up therapy are one component of a multi-disciplinary discharge planning process, led by the attending physician.  Recommendations may be updated based on patient status, additional functional criteria and insurance authorization. ? ?Follow Up Recommendations Skilled nursing-short term rehab (<3 hours/day) ? ?  ?Assistance Recommended at Discharge Frequent or constant Supervision/Assistance  ?Patient can return home with the following ? Two people to help with walking and/or transfers;Two people to help with bathing/dressing/bathroom;Assistance with cooking/housework;Assistance with feeding;Direct supervision/assist for medications management;Direct  supervision/assist for financial management;Assist for transportation;Help with stairs or ramp for entrance ? ?  ?Equipment Recommendations Other (comment) (TBD at next venue of care)  ?Recommendations for Other Services ?    ?  ?Functional Status Assessment Patient has had a recent decline in their functional status and demonstrates the ability to make significant improvements in function in a reasonable and predictable amount of time.  ? ?  ?Precautions / Restrictions Precautions ?Precautions: Fall ?Restrictions ?Weight Bearing Restrictions: Yes ?RLE Weight Bearing: Weight bearing as tolerated  ? ?  ? ?Mobility ? Bed Mobility ?Overal bed mobility: Needs Assistance ?Bed Mobility: Supine to Sit, Sit to Supine ?  ?  ?Supine to sit: +2 for physical assistance, +2 for safety/equipment, Mod assist ?Sit to supine: Min assist, +2 for physical assistance, +2 for safety/equipment ?  ?General bed mobility comments: mod Ax2 for trunk elevation and LE management to sit at EOB. Pt requiring max cuing with simple commands with tasks. Pt min Ax2 for trunk and LE management with return to supine. ?  ? ?Transfers ?Overall transfer level: Needs assistance ?Equipment used: 2 person hand held assist ?Transfers: Sit to/from Stand ?Sit to Stand: Min assist, +2 safety/equipment, +2 physical assistance ?  ?  ?  ?  ?  ?General transfer comment: requiring min Ax2 for safety and steadying with transfer. Pt not WB much through RLE. Pt calling out for his daughter and deceased relatives during transfer and side steps at EOB. Pt requires max cuing throughout using simple commands. Pt able to take side steps at EOB with difficulty sequencing. ?  ? ?Ambulation/Gait ?  ?  ?  ?  ?  ?  ?  ?General Gait Details: pt able to take side steps at EOB with difficulty sequencing and yelling out that he needs to sit down. ? ?  Stairs ?  ?  ?  ?  ?  ? ?Wheelchair Mobility ?  ? ?Modified Rankin (Stroke Patients Only) ?  ? ?  ? ?Balance Overall balance  assessment: Needs assistance ?Sitting-balance support: Feet unsupported, Single extremity supported ?Sitting balance-Leahy Scale: Poor ?Sitting balance - Comments: pt requiring single UE support and min guard A in sitting for safety and steadying. ?  ?Standing balance support: Reliant on assistive device for balance, During functional activity, Bilateral upper extremity supported ?Standing balance-Leahy Scale: Poor ?Standing balance comment: reliant on BUE support and external assist ?  ?  ?  ?  ?  ?  ?  ?  ?  ?  ?  ?   ? ? ? ?Pertinent Vitals/Pain Pain Assessment ?Pain Assessment: Faces ?Faces Pain Scale: Hurts little more ?Pain Location: R hip with movement ?Pain Descriptors / Indicators: Discomfort, Grimacing, Guarding, Moaning ?Pain Intervention(s): Monitored during session, Limited activity within patient's tolerance  ? ? ?Home Living Family/patient expects to be discharged to:: Assisted living ?  ?  ?  ?  ?  ?  ?  ?  ?  ?Additional Comments: [facility]. Per Freida BusmanAllen, pt's son in law, pt keeps falling at memory care. He keeps trying to stand up and follow staff out the room from wheelchair. Freida Busmanllen states he and his wife cannot care for pt on their own. Prefers that pt get rehab and return to memory care. Pt current hospice pt and hospice is getting bed and chair alarm. Needs a walker and using a wheelchair.  ?  ?Prior Function Prior Level of Function : Needs assist;History of Falls (last six months) ? Cognitive Assist : Mobility (cognitive);ADLs (cognitive) ?Mobility (Cognitive): Set up cues ?ADLs (Cognitive): Step by step cues ?Physical Assist : Mobility (physical);ADLs (physical) ?Mobility (physical): Bed mobility;Transfers;Gait ?ADLs (physical): Bathing;Dressing;Toileting;IADLs;Grooming ?Mobility Comments: staff assist with mobility after R hip ORIF. Mostly using wheelchair now ?ADLs Comments: Staff assists with all ADL/iADLs as needed ?  ? ? ?Hand Dominance  ?   ? ?   ?Extremity/Trunk Assessment  ? Upper Extremity Assessment ?Upper Extremity Assessment: Overall WFL for tasks assessed ?  ? ?Lower Extremity Assessment ?Lower Extremity Assessment: Generalized weakness;RLE deficits/detail ?RLE Deficits / Details: recent R hip hemiarthoplasty w/ ORIF (03/06/22); difficult to assess strength due to advanced nature of dementia and pt's HOH. Pt with decreased muscle mass b/l LE ?RLE Coordination: decreased gross motor ?  ? ?Cervical / Trunk Assessment ?Cervical / Trunk Assessment: Kyphotic  ?Communication  ? Communication: HOH;Other (comment) (hears better out of L ear. Perseverates "I need help" and "oh god bless *relatives name*". Son in law stating pt calling out dead relatives names repeatedly)  ?Cognition Arousal/Alertness: Awake/alert ?Behavior During Therapy: Hosp DamasWFL for tasks assessed/performed, Anxious ?Overall Cognitive Status: History of cognitive impairments - at baseline ?  ?  ?  ?  ?  ?  ?  ?  ?  ?  ?  ?  ?  ?  ?  ?  ?General Comments: h/o advanced dementia, however agreeable to participation with max cuing and set up task. Perseverates frequently during session "I need help", "I need to pee", "god bless *relatives name*" ?  ?  ? ?  ?General Comments General comments (skin integrity, edema, etc.): pt's son in law, Freida Busmanllen, present ? ?  ?Exercises    ? ?Assessment/Plan  ?  ?PT Assessment Patient needs continued PT services  ?PT Problem List Decreased strength;Decreased range of motion;Decreased activity  tolerance;Decreased balance;Decreased mobility;Decreased coordination;Decreased cognition;Decreased safety awareness;Pain ? ?   ?  ?PT Treatment Interventions DME instruction;Gait training;Functional mobility training;Therapeutic activities;Therapeutic exercise;Balance training;Cognitive remediation;Patient/family education   ? ?PT Goals (Current goals can be found in the Care Plan section)  ?Acute Rehab PT Goals ?Patient Stated Goal: unsure ?PT Goal Formulation: With family ?Time  For Goal Achievement: 03/26/22 ?Potential to Achieve Goals: Good ? ?  ?Frequency Min 2X/week ?  ? ? ?Co-evaluation PT/OT/SLP Co-Evaluation/Treatment: Yes ?Reason for Co-Treatment: Complexity of the patient's impairments (multi-system inv

## 2022-03-13 DIAGNOSIS — D631 Anemia in chronic kidney disease: Secondary | ICD-10-CM | POA: Diagnosis present

## 2022-03-13 DIAGNOSIS — H9190 Unspecified hearing loss, unspecified ear: Secondary | ICD-10-CM | POA: Diagnosis present

## 2022-03-13 DIAGNOSIS — F0390 Unspecified dementia without behavioral disturbance: Secondary | ICD-10-CM | POA: Diagnosis not present

## 2022-03-13 DIAGNOSIS — N390 Urinary tract infection, site not specified: Secondary | ICD-10-CM | POA: Diagnosis present

## 2022-03-13 DIAGNOSIS — X58XXXD Exposure to other specified factors, subsequent encounter: Secondary | ICD-10-CM | POA: Diagnosis present

## 2022-03-13 DIAGNOSIS — R400 Somnolence: Secondary | ICD-10-CM | POA: Diagnosis not present

## 2022-03-13 DIAGNOSIS — N3001 Acute cystitis with hematuria: Secondary | ICD-10-CM | POA: Diagnosis not present

## 2022-03-13 DIAGNOSIS — N1832 Chronic kidney disease, stage 3b: Secondary | ICD-10-CM | POA: Diagnosis present

## 2022-03-13 DIAGNOSIS — R296 Repeated falls: Secondary | ICD-10-CM | POA: Diagnosis present

## 2022-03-13 DIAGNOSIS — Z79899 Other long term (current) drug therapy: Secondary | ICD-10-CM | POA: Diagnosis not present

## 2022-03-13 DIAGNOSIS — N183 Chronic kidney disease, stage 3 unspecified: Secondary | ICD-10-CM | POA: Diagnosis not present

## 2022-03-13 DIAGNOSIS — N3000 Acute cystitis without hematuria: Secondary | ICD-10-CM | POA: Diagnosis present

## 2022-03-13 DIAGNOSIS — Z66 Do not resuscitate: Secondary | ICD-10-CM | POA: Diagnosis present

## 2022-03-13 DIAGNOSIS — Z96643 Presence of artificial hip joint, bilateral: Secondary | ICD-10-CM | POA: Diagnosis present

## 2022-03-13 DIAGNOSIS — Z885 Allergy status to narcotic agent status: Secondary | ICD-10-CM | POA: Diagnosis not present

## 2022-03-13 DIAGNOSIS — Z751 Person awaiting admission to adequate facility elsewhere: Secondary | ICD-10-CM | POA: Diagnosis not present

## 2022-03-13 DIAGNOSIS — G629 Polyneuropathy, unspecified: Secondary | ICD-10-CM | POA: Diagnosis present

## 2022-03-13 DIAGNOSIS — Z9049 Acquired absence of other specified parts of digestive tract: Secondary | ICD-10-CM | POA: Diagnosis not present

## 2022-03-13 DIAGNOSIS — S72141D Displaced intertrochanteric fracture of right femur, subsequent encounter for closed fracture with routine healing: Secondary | ICD-10-CM | POA: Diagnosis not present

## 2022-03-13 DIAGNOSIS — F03C4 Unspecified dementia, severe, with anxiety: Secondary | ICD-10-CM | POA: Diagnosis present

## 2022-03-13 DIAGNOSIS — N400 Enlarged prostate without lower urinary tract symptoms: Secondary | ICD-10-CM | POA: Diagnosis present

## 2022-03-13 DIAGNOSIS — T43025A Adverse effect of tetracyclic antidepressants, initial encounter: Secondary | ICD-10-CM | POA: Diagnosis not present

## 2022-03-13 DIAGNOSIS — T43215A Adverse effect of selective serotonin and norepinephrine reuptake inhibitors, initial encounter: Secondary | ICD-10-CM | POA: Diagnosis not present

## 2022-03-13 DIAGNOSIS — Y92239 Unspecified place in hospital as the place of occurrence of the external cause: Secondary | ICD-10-CM | POA: Diagnosis not present

## 2022-03-13 LAB — BASIC METABOLIC PANEL
Anion gap: 9 (ref 5–15)
BUN: 35 mg/dL — ABNORMAL HIGH (ref 8–23)
CO2: 23 mmol/L (ref 22–32)
Calcium: 8.2 mg/dL — ABNORMAL LOW (ref 8.9–10.3)
Chloride: 107 mmol/L (ref 98–111)
Creatinine, Ser: 1.98 mg/dL — ABNORMAL HIGH (ref 0.61–1.24)
GFR, Estimated: 32 mL/min — ABNORMAL LOW (ref >60)
Glucose, Bld: 82 mg/dL (ref 70–99)
Potassium: 4.6 mmol/L (ref 3.5–5.1)
Sodium: 139 mmol/L (ref 135–145)

## 2022-03-13 LAB — CBC
HCT: 28.7 % — ABNORMAL LOW (ref 39.0–52.0)
Hemoglobin: 9.3 g/dL — ABNORMAL LOW (ref 13.0–17.0)
MCH: 31.2 pg (ref 26.0–34.0)
MCHC: 32.4 g/dL (ref 30.0–36.0)
MCV: 96.3 fL (ref 80.0–100.0)
Platelets: 344 10*3/uL (ref 150–400)
RBC: 2.98 MIL/uL — ABNORMAL LOW (ref 4.22–5.81)
RDW: 14.6 % (ref 11.5–15.5)
WBC: 12.7 10*3/uL — ABNORMAL HIGH (ref 4.0–10.5)
nRBC: 0 % (ref 0.0–0.2)

## 2022-03-13 MED ORDER — SODIUM CHLORIDE 0.45 % IV SOLN: INTRAVENOUS | Status: DC

## 2022-03-13 NOTE — Plan of Care (Signed)
  Problem: Activity: Goal: Risk for activity intolerance will decrease Outcome: Progressing   Problem: Nutrition: Goal: Adequate nutrition will be maintained Outcome: Progressing   Problem: Pain Managment: Goal: General experience of comfort will improve Outcome: Progressing   Problem: Safety: Goal: Ability to remain free from injury will improve Outcome: Progressing   Problem: Skin Integrity: Goal: Risk for impaired skin integrity will decrease Outcome: Progressing   

## 2022-03-13 NOTE — Progress Notes (Signed)
? ? ? ?  Subjective: ?Patient confused this afternoon. Lying comfortably in bed. Daughter at bedside. Daughter states due to patient's dementia he has been leaving the bed at facility at night without walker and losing his balance sustaining 2 falls since discharge. Worked well with PT yesterday. Plan to discharge to SNF for further rehab before returning to his memory care facility. ? ?Objective:  ? ?VITALS:   ?Vitals:  ? 03/12/22 1615 03/12/22 2029 03/13/22 0719 03/13/22 1501  ?BP: 103/65 107/61 106/72 126/73  ?Pulse: 68 65 60 66  ?Resp: 18 18 18 16   ?Temp: 98.5 ?F (36.9 ?C) 98 ?F (36.7 ?C) 98.3 ?F (36.8 ?C) 97.8 ?F (36.6 ?C)  ?TempSrc: Oral Oral Oral Oral  ?SpO2: 95% 96% 96% 98%  ?Weight:      ?Height:      ? ? ?Intact pulses distally ?Dorsiflexion/Plantar flexion intact ?Incision: dressing C/D/I ?No cellulitis present ?Compartment soft ? ? ?Lab Results  ?Component Value Date  ? WBC 12.7 (H) 03/13/2022  ? HGB 9.3 (L) 03/13/2022  ? HCT 28.7 (L) 03/13/2022  ? MCV 96.3 03/13/2022  ? PLT 344 03/13/2022  ? ?BMET ?   ?Component Value Date/Time  ? NA 139 03/13/2022 0434  ? K 4.6 03/13/2022 0434  ? CL 107 03/13/2022 0434  ? CO2 23 03/13/2022 0434  ? GLUCOSE 82 03/13/2022 0434  ? BUN 35 (H) 03/13/2022 0434  ? CREATININE 1.98 (H) 03/13/2022 0434  ? CALCIUM 8.2 (L) 03/13/2022 0434  ? GFRNONAA 32 (L) 03/13/2022 0434  ? ? ?Assessment/Plan: ?   ? ?Principal Problem: ?  Complicated UTI (urinary tract infection) ?Active Problems: ?  Anemia, chronic renal failure, stage 3 (moderate) (HCC) ?  Falls frequently ?  Hip fracture requiring operative repair, left, closed, with routine healing, subsequent encounter ?  UTI (urinary tract infection) ? ?R hip hemiarthroplasty 4/20 ? ?Post op recs: ?WB: WBAT ?Dressing: Aquacel dressing to be kept intact until follow-up ?DVT prophylaxis: lovenox starting POD1 x4 weeks ?Follow up: 2 weeks after surgery for a wound check with Dr. Zachery Dakins at [facility].  ?Address: [address] [place], [place], [place] [address]  ?Office Phone: [phone] ? ? ? ?Harvy Riera A Aubrianna Orchard ?03/13/2022, 8:02 PM ? ? ?Charlies Constable, MD ? ?Contact information:   ?Weekdays 7am-5pm epic message Dr. Zachery Dakins, or call office for patient follow up: [phone] ?After hours and holidays please check [email or web address] for group call information for [facility] ? ?  ?

## 2022-03-13 NOTE — [place] FL2 (Signed)
?[place] MEDICAID FL2 LEVEL OF CARE SCREENING TOOL  ?  ? ?IDENTIFICATION  ?Patient Name: ?Jesse Cherry Birthdate: Jan 05, 1933 Sex: male Admission Date (Current Location): ?03/11/2022  ?[place] and [place] Number: ? [place] ?  Facility and Address:  ?[facility] [place] [address] [place], [place] [address] ?     Provider Number: ?[id number]  ?Attending Physician Name and Address:  ?Bonnielee Haff, MD ? Relative Name and Phone Number:  ?[name],[name] Daughter [phone] ?   ?Current Level of Care: ?Hospital Recommended Level of Care: ?[place] Prior Approval Number: ?  ? ?Date Approved/Denied: ?  PASRR Number: ?[id number] ? ?Discharge Plan: ?SNF ?  ? ?Current Diagnoses: ?Patient Active Problem List  ? Diagnosis Date Noted  ? UTI (urinary tract infection) 03/13/2022  ? Falls frequently 03/11/2022  ? Complicated UTI (urinary tract infection) 03/11/2022  ? Hip fracture requiring operative repair, left, closed, with routine healing, subsequent encounter 03/11/2022  ? Hip fracture ([place]) 03/05/2022  ? Closed right hip fracture ([place]) 03/05/2022  ? Anxiety 04/06/2018  ? Idiopathic peripheral neuropathy 04/06/2018  ? Anemia, chronic renal failure, stage 3 (moderate) ([place]) 11/23/2015  ? Benign prostatic hyperplasia without lower urinary tract symptoms 07/09/2014  ? ? ?Orientation RESPIRATION BLADDER Height & Weight   ?  ?Self ? Normal Incontinent Weight: 164 lb 10.9 oz (74.7 kg) ?Height:  5\' 7"  (170.2 cm)  ?BEHAVIORAL SYMPTOMS/MOOD NEUROLOGICAL BOWEL NUTRITION STATUS  ?    Incontinent Diet (see discharge summary)  ?AMBULATORY STATUS COMMUNICATION OF NEEDS Skin   ?Extensive Assist Verbally Normal ?  ?  ?  ?    ?     ?     ? ? ?Personal Care Assistance Level of Assistance  ?Bathing, Feeding, Dressing Bathing Assistance: Maximum assistance ?Feeding assistance: Limited assistance ?Dressing Assistance: Maximum assistance ?   ? ?Functional Limitations Info  ?Sight, Hearing, Speech Sight  Info: Adequate ?Hearing Info: Impaired ?Speech Info: Adequate  ? ? ?SPECIAL CARE FACTORS FREQUENCY  ?PT (By licensed PT), OT (By licensed OT)   ?  ?PT Frequency: 5x week ?OT Frequency: 5x week ?  ?  ?  ?   ? ? ?Contractures Contractures Info: Not present  ? ? ?Additional Factors Info  ?Code Status, Allergies Code Status Info: DNR ?Allergies Info: Demerol (Meperidine Hcl) ?  ?  ?  ?   ? ?Current Medications (03/13/2022):  This is the current hospital active medication list ?Current Facility-Administered Medications  ?Medication Dose Route Frequency Provider Last Rate Last Admin  ? 0.45 % sodium chloride infusion   Intravenous Continuous Bonnielee Haff, MD 75 mL/hr at 03/13/22 0755 New Bag at 03/13/22 0755  ? acetaminophen (TYLENOL) tablet 650 mg  650 mg Oral Q6H PRN Marcelyn Bruins, MD   650 mg at 03/13/22 0112  ? Or  ? acetaminophen (TYLENOL) suppository 650 mg  650 mg Rectal Q6H PRN Marcelyn Bruins, MD      ? busPIRone (BUSPAR) tablet 5 mg  5 mg Oral QHS Marcelyn Bruins, MD   5 mg at 03/12/22 2123  ? cefTRIAXone (ROCEPHIN) 1 g in sodium chloride 0.9 % 100 mL IVPB  1 g Intravenous Q24H Marcelyn Bruins, MD 200 mL/hr at 03/12/22 2138 1 g at 03/12/22 2138  ? divalproex (DEPAKOTE) DR tablet 125 mg  125 mg Oral BID Marcelyn Bruins, MD   125 mg at 03/13/22 [id number]  ? enoxaparin (LOVENOX) injection 30 mg  30 mg Subcutaneous Q24H Marcelyn Bruins, MD  30 mg at 03/12/22 2124  ? ferrous sulfate tablet 325 mg  325 mg Oral Daily Marcelyn Bruins, MD   325 mg at 03/13/22 [id number]  ? haloperidol (HALDOL) tablet 0.5 mg  0.5 mg Oral Q8H PRN Marcelyn Bruins, MD   0.5 mg at 03/13/22 0113  ? mirtazapine (REMERON) tablet 15 mg  15 mg Oral QHS Marcelyn Bruins, MD   15 mg at 03/12/22 2123  ? polyethylene glycol (MIRALAX / GLYCOLAX) packet 17 g  17 g Oral Daily PRN Marcelyn Bruins, MD      ? sertraline (ZOLOFT) tablet 25 mg  25 mg Oral Daily Marcelyn Bruins, MD   25 mg at 03/13/22 [id number]  ? sodium  bicarbonate tablet 650 mg  650 mg Oral BID Marcelyn Bruins, MD   650 mg at 03/13/22 [id number]  ? sodium chloride flush (NS) 0.9 % injection 3 mL  3 mL Intravenous Q12H Marcelyn Bruins, MD   3 mL at 03/13/22 1059  ? traZODone (DESYREL) tablet 50 mg  50 mg Oral QHS Marcelyn Bruins, MD   50 mg at 03/12/22 2123  ? ? ? ?Discharge Medications: ?Please see discharge summary for a list of discharge medications. ? ?Relevant Imaging Results: ? ?Relevant Lab Results: ? ? ?Additional Information ?SSN: [id number]. Pt is not vaccinated for covid. ? ?Joanne Chars, LCSW ? ? ? ? ?

## 2022-03-13 NOTE — Plan of Care (Signed)
  Problem: Nutrition: Goal: Adequate nutrition will be maintained Outcome: Progressing   Problem: Safety: Goal: Ability to remain free from injury will improve Outcome: Progressing   Problem: Skin Integrity: Goal: Risk for impaired skin integrity will decrease Outcome: Progressing   

## 2022-03-13 NOTE — TOC Initial Note (Signed)
Transition of Care (TOC) - Initial/Assessment Note  ? ? ?Patient Details  ?Name: Jesse Cherry ?MRN: [id number] ?Date of Birth: [birth year] ? ?Transition of Care [facility]) CM/SW Contact:    ?Joanne Chars, LCSW ?Phone Number: ?03/13/2022, 2:52 PM ? ?Clinical Narrative: CSW spoke with pt daughter [name].  Pt is from [place], recent DC from hospital back to [facility] after hip fracture, pt has fallen several times.  [facility] is involved.  Daughter now interested in pursuing SNF, choice document given, permission given to send out referral in hub.  Daughter is in [place] and would like facility closer to that area. ? ?Referral sent out in hub for SNF.             ? ? ?Expected Discharge Plan: [place] ?Barriers to Discharge: Continued Medical Work up, SNF Pending bed offer ? ? ?Patient Goals and CMS Choice ?  ?CMS Medicare.gov Compare Post Acute Care list provided to:: Patient Represenative (must comment) ?Choice offered to / list presented to : Adult Children ? ?Expected Discharge Plan and Services ?Expected Discharge Plan: [place] ?In-house Referral: Clinical Social Work ?  ?Post Acute Care Choice: [place] ?Living arrangements for the past 2 months: [place] ?                ?  ?  ?  ?  ?  ?  ?  ?  ?  ?  ? ?Prior Living Arrangements/Services ?Living arrangements for the past 2 months: [place] ?Lives with:: Facility Resident ?Patient language and need for interpreter reviewed:: No ?       ?Need for Family Participation in Patient Care: Yes (Comment) ?Care giver support system in place?: Yes (comment) ?Current home services: Hospice ?Criminal Activity/Legal Involvement Pertinent to Current Situation/Hospitalization: No - Comment as needed ? ?Activities of Daily Living ?  ?  ? ?Permission Sought/Granted ?  ?  ?   ?   ?   ?   ? ?Emotional Assessment ?Appearance:: Appears stated age ?Attitude/Demeanor/Rapport: Unable  to Assess ?Affect (typically observed): Unable to Assess ?Orientation: : Oriented to Self ?Alcohol / Substance Use: Not Applicable ?Psych Involvement: No (comment) ? ?Admission diagnosis:  Acute cystitis with hematuria [N30.01] ?Fall, initial encounter [W19.XXXA] ?Hip fracture requiring operative repair, left, closed, with routine healing, subsequent encounter [S72.002D] ?Dementia, unspecified dementia severity, unspecified dementia type, unspecified whether behavioral, psychotic, or mood disturbance or anxiety ([place]) [F03.90] ?UTI (urinary tract infection) [N39.0] ?Patient Active Problem List  ? Diagnosis Date Noted  ? UTI (urinary tract infection) 03/13/2022  ? Falls frequently 03/11/2022  ? Complicated UTI (urinary tract infection) 03/11/2022  ? Hip fracture requiring operative repair, left, closed, with routine healing, subsequent encounter 03/11/2022  ? Hip fracture ([place]) 03/05/2022  ? Closed right hip fracture ([place]) 03/05/2022  ? Anxiety 04/06/2018  ? Idiopathic peripheral neuropathy 04/06/2018  ? Anemia, chronic renal failure, stage 3 (moderate) ([place]) 11/23/2015  ? Benign prostatic hyperplasia without lower urinary tract symptoms 07/09/2014  ? ?PCP:  Jonathon Bellows, PA-C ?Pharmacy:   ?[id number] - [place] ?[place] ?[place] [address] ?Phone: [phone] Fax: [phone] ? ?[facility] [id number] - [place] - [address] [place] [address] ?[address] ?[place] [address] ?Phone: [phone] Fax: [phone] ? ? ? ? ?Social Determinants of Health (SDOH) Interventions ?  ? ?Readmission Risk  Interventions ?   ? View : No data to display.  ?  ?  ?  ? ? ? ?

## 2022-03-13 NOTE — Progress Notes (Signed)
? ?[facility] ?PROGRESS NOTE ? ? ?Jesse Cherry [id number] DOB: 01/06/1933 DOA: 03/11/2022 ? ?PCP: Bailey MechPodraza, Cole Christopher, PA-C ? ?Brief History/Interval Summary: 88 y.o. male with medical history significant of hip fracture, dementia, CKD, anemia, anxiety, BPH, neuropathy presented after a fall at his memory care unit. History provided with assistance of daughter and chart review due to patient's baseline dementia.  Apparently has had multiple falls recently.  Recently hospitalized for hip fracture after another fall.  He underwent surgical intervention.  Found to have a urinary tract infection.  Imaging studies of the head raise concern for displacement.  Orthopedics was consulted.   ? ?Consultants: Orthopedics ? ?Procedures: None ? ? ?Subjective/Interval History: ?Patient lying comfortably on the bed.  Pleasantly confused.  Sitter at bedside.   ? ? ?Assessment/Plan: ? ?Recurrent falls ?Patient has had numerous falls at his assisted living facility recently.  UTI may have contributed.  His dementia is likely playing a major role.  Seen by PT and OT.  Skilled nursing facility is recommended.  ? ?Acute UTI ?UA was noted to be abnormal.  Started on ceftriaxone.  Multiple species noted on urine culture.  Continue ceftriaxone while he is in the hospital and then transition to oral agents at discharge.   ? ?Recent hip fracture ?Underwent right hip hemiarthroplasty earlier this month.  Initial imaging studies raise concern for displacement.  CT was done.  Orthopedics was consulted.  They have reviewed the CT report.  No intervention is recommended.  Weight-bear as tolerated.  PT and OT evaluation.  Pain control. ?  ?Anxiety/Dementia ?Noted to be on multiple psychotropic agents including BuSpar, Depakote, Remeron, Zoloft, trazodone.  Polypharmacy could be contributing. ?Requiring sitter while in the hospital.  Episodes of agitation noted. ? ?CKD3b ?Creatinine noted to be slightly higher today compared to  yesterday but review of old labs suggest that this could be within his baseline range.  Gently hydrate for 24 hours.  Recheck labs tomorrow.  Continue sodium bicarbonate. ?Monitor urine output. ?  ?Anemia of chronic kidney disease ?Stable hemoglobin noted.  Noted to be on iron supplements which is being continued. ? ? ?DVT Prophylaxis: Lovenox ?Code Status: DNR ?Family Communication: No family at bedside ?Disposition Plan: SNF ? ?Status is: Observation ?The patient will require care spanning > 2 midnights and should be moved to inpatient because: Frequent falls, unsafe discharge ? ? ? ? ? ?Medications: Scheduled: ? busPIRone  5 mg Oral QHS  ? divalproex  125 mg Oral BID  ? enoxaparin (LOVENOX) injection  30 mg Subcutaneous Q24H  ? ferrous sulfate  325 mg Oral Daily  ? mirtazapine  15 mg Oral QHS  ? sertraline  25 mg Oral Daily  ? sodium bicarbonate  650 mg Oral BID  ? sodium chloride flush  3 mL Intravenous Q12H  ? traZODone  50 mg Oral QHS  ? ?Continuous: ? sodium chloride 75 mL/hr at 03/13/22 0755  ? cefTRIAXone (ROCEPHIN)  IV 1 g (03/12/22 2138)  ? ?WJX:[id number]:acetaminophen **OR** acetaminophen, haloperidol, polyethylene glycol ? ?Antibiotics: ?Anti-infectives (From admission, onward)  ? ? Start     Dose/Rate Route Frequency Ordered Stop  ? 03/11/22 2000  cefTRIAXone (ROCEPHIN) 1 g in sodium chloride 0.9 % 100 mL IVPB       ? 1 g ?200 mL/hr over 30 Minutes Intravenous Every 24 hours 03/11/22 1935    ? 03/11/22 1815  sulfamethoxazole-trimethoprim (BACTRIM DS) 800-160 MG per tablet 1 tablet       ? 1 tablet Oral  Once 03/11/22 1807 03/11/22 1827  ? ?  ? ? ?Objective: ? ?Vital Signs ? ?Vitals:  ? 03/12/22 1318 03/12/22 1615 03/12/22 2029 03/13/22 0719  ?BP: 106/61 103/65 107/61 106/72  ?Pulse: 65 68 65 60  ?Resp: 18 18 18 18   ?Temp:  98.5 ?F (36.9 ?C) 98 ?F (36.7 ?C) 98.3 ?F (36.8 ?C)  ?TempSrc:  Oral Oral Oral  ?SpO2: 95% 95% 96% 96%  ?Weight:      ?Height:      ? ? ?Intake/Output Summary (Last 24 hours) at 03/13/2022  03/15/2022 ?Last data filed at 03/13/2022 03/15/2022 ?Gross per 24 hour  ?Intake 60 ml  ?Output --  ?Net 60 ml  ? ? ?Filed Weights  ? 03/11/22 1323  ?Weight: 74.7 kg  ? ? ?General appearance: Awake alert.  In no distress.  Distracted ?Resp: Clear to auscultation bilaterally.  Normal effort ?Cardio: S1-S2 is normal regular.  No S3-S4.  No rubs murmurs or bruit ?GI: Abdomen is soft.  Nontender nondistended.  Bowel sounds are present normal.  No masses organomegaly ?Extremities: No edema.   ?Neurologic: .  No focal neurological deficits.  ? ? ? ?Lab Results: ? ?Data Reviewed: I have personally reviewed following labs and reports of the imaging studies ? ?CBC: ?Recent Labs  ?Lab 03/08/22 ?0125 03/09/22 ?0157 03/11/22 ?1600 03/12/22 ?0225 03/13/22 ?0434  ?WBC 9.9 9.2 12.1* 11.1* 12.7*  ?NEUTROABS  --   --  8.6*  --   --   ?HGB 7.6* 7.7* 9.7* 8.9* 9.3*  ?HCT 24.0* 24.1* 30.0* 27.5* 28.7*  ?MCV 97.2 98.0 97.7 97.2 96.3  ?PLT 235 256 319 328 344  ? ? ? ?Basic Metabolic Panel: ?Recent Labs  ?Lab 03/08/22 ?0125 03/09/22 ?0157 03/11/22 ?1600 03/12/22 ?0225 03/13/22 ?0434  ?NA 138 139 138 140 139  ?K 4.5 4.3 4.8 4.4 4.6  ?CL 112* 109 106 110 107  ?CO2 20* 24 22 23 23   ?GLUCOSE 93 79 99 77 82  ?BUN 47* 38* 38* 36* 35*  ?CREATININE 1.90* 1.77* 1.80* 1.82* 1.98*  ?CALCIUM 7.9* 7.9* 8.1* 8.1* 8.2*  ? ? ? ?GFR: ?Estimated Creatinine Clearance: 25 mL/min (A) (by C-G formula based on SCr of 1.98 mg/dL (H)). ? ?Liver Function Tests: ?Recent Labs  ?Lab 03/11/22 ?1600 03/12/22 ?0225  ?AST 16 12*  ?ALT 7 8  ?ALKPHOS 61 58  ?BILITOT 0.7 0.6  ?PROT 6.3* 5.9*  ?ALBUMIN 2.7* 2.4*  ? ? ? ?Recent Results (from the past 240 hour(s))  ?MRSA Next Gen by PCR, Nasal     Status: None  ? Collection Time: 03/05/22  7:10 PM  ? Specimen: Nasal Mucosa; Nasal Swab  ?Result Value Ref Range Status  ? MRSA by PCR Next Gen NOT DETECTED NOT DETECTED Final  ?  Comment: (NOTE) ?The GeneXpert MRSA Assay (FDA approved for NASAL specimens only), ?is one component of a  comprehensive MRSA colonization surveillance ?program. It is not intended to diagnose MRSA infection nor to guide ?or monitor treatment for MRSA infections. ?Test performance is not FDA approved in patients less than 2 years ?old. ?Performed at [facility], [address] [place], [address] ?[place] ?  ?Urine Culture     Status: Abnormal  ? Collection Time: 03/11/22  3:27 PM  ? Specimen: Urine, Clean Catch  ?Result Value Ref Range Status  ? Specimen Description URINE, CLEAN CATCH  Final  ? Special Requests   Final  ?  NONE ?Performed at [facility], [address], [place], [place] [address] ?  ? Culture MULTIPLE SPECIES PRESENT, SUGGEST RECOLLECTION (A)  Final  ? Report Status 03/12/2022 FINAL  Final  ?Culture, blood (routine x 2)     Status: None (Preliminary result)  ? Collection Time: 03/11/22  6:54 PM  ? Specimen: BLOOD  ?Result Value Ref Range Status  ? Specimen Description BLOOD SITE NOT SPECIFIED  Final  ? Special Requests   Final  ?  BOTTLES DRAWN AEROBIC AND ANAEROBIC Blood Culture results may not be optimal due to an inadequate volume of blood received in culture bottles  ? Culture   Final  ?  NO GROWTH < 12 HOURS ?Performed at [facility], [address] [place], [place] [address] ?  ? Report Status PENDING  Incomplete  ? ?  ? ? ?Radiology Studies: ?CT Head Wo Contrast ? ?Result Date: 03/11/2022 ?CLINICAL DATA:  Head trauma, fall EXAM: CT HEAD WITHOUT CONTRAST TECHNIQUE: Contiguous axial images were obtained from the base of the skull through the vertex without intravenous contrast. RADIATION DOSE REDUCTION: This exam was performed according to the departmental dose-optimization program which includes automated exposure control, adjustment of the mA and/or kV according to patient size and/or use of iterative reconstruction technique. COMPARISON:  CT head 03/05/2022 FINDINGS: Brain: No acute intracranial hemorrhage, mass effect, or herniation. No extra-axial fluid collections. No  evidence of acute territorial infarct. No hydrocephalus. Moderate cortical volume loss. Patchy hypodensities in the periventricular and subcortical white matter, likely secondary to chronic microvascular ischemic change

## 2022-03-13 NOTE — Progress Notes (Signed)
[facility]) Hospital Liaison RN note    ?  ?This is a current [facility] hospice patient from [facility] with a terminal diagnosis of protein calorie malnutrition. He was transported to ED for evaluation following a fall. He  was admitted to the hospital on 03/11/22 for pain management and evaluation of previous femur fracture. Per Dr. Gildardo Cranker, an [facility] Physician,  this is a related hospital admission.  ?  ?This patient is appropriate for inpatient hospice care for symptom management, IV antibiotics/fluids and safe discharge plan.  ?  ?Checked in with bedside RN prior to visit, who stated that patient had a restful night after prn adm of prns and currently is awaiting evaluation for Rehab at a SNF unit.  ?Visited at bedside with Daughter [name] at side (no sitter in room at this time). Patient was resting quietly in bed with eyes closed in NAD. Patient responded to verbal stimuli but did not engage in conversation during visit. Supported and listened to daughter who is hopeful he will be able to discharge to Rehab to improve his strength and quality of life. Encouraged daughter to call [facility] as needed and made aware that a [place] will visit patient daily during this hospitalization.  ?  ?VS: 98.3, 60, 18, 106/72, 96% sats on RA ?I/O: 60/0 (+60) ?  ?Abnormal labs: New labs on 4/27: WBC 12.7, RBC 2.98, Hemo 9.3, HCT 28.7, BUN 35, Creat 1.98, Cal 8.2 ?Diagnostics: No new xrays since 03/11/22 ?IV/PRN medications: Rocephin 1g IVPB QD, Tylenol 650 mg tab PO adm 0112 on 4/27, Haldol 0.23m PO tab adm 0113 on 4/27 ?  ?  ?Problem list on EPIC (MD Notes): ? Recurrent falls ?Patient has had numerous falls at his assisted living facility recently.  UTI may have contributed.  His dementia is likely playing a major role.  Seen by PT and OT.  Skilled nursing facility is recommended.  ?Acute UTI ?UA was noted to be abnormal.  Started on ceftriaxone.  Multiple species noted on urine  culture.  Continue ceftriaxone while he is in the hospital and then transition to oral agents at discharge.   ?Recent hip fracture ?Underwent right hip hemiarthroplasty earlier this month.  Initial imaging studies raise concern for displacement.  CT was done.  Orthopedics was consulted.  They have reviewed the CT report.  No intervention is recommended.  Weight-bear as tolerated.  PT and OT evaluation.  Pain control. ?Anxiety/Dementia ?Noted to be on multiple psychotropic agents including BuSpar, Depakote, Remeron, Zoloft, trazodone.  Polypharmacy could be contributing. ?Requiring sitter while in the hospital.  Episodes of agitation noted. ?CKD3b ?Creatinine noted to be slightly higher today compared to yesterday but review of old labs suggest that this could be within his baseline range.  Gently hydrate for 24 hours.  Recheck labs tomorrow.  Continue sodium bicarbonate. ?Monitor urine output. ? Anemia of chronic kidney disease ?Stable hemoglobin noted.  Noted to be on iron supplements which is being continued. ? Disposition Plan: SNF ?  ?Discharge planning: ongoing, evaluation for SNF for rehab.  ?Family contact: Supported daughter at bedside - has ACC contact information ?IDG: Updated ACC team  ?GOC: Clear - DNR ?  ?Should patient need ambulance transport at discharge please use [facility] as [facility] contracts this service with them for our active hospice patients. ?  ?Please do not hesitate to call with hospice related questions/concerns, ?  ?Gar Ponto, RN    ?[facility] Liaison    ?  [phone]  ?  ?  ? ?

## 2022-03-14 DIAGNOSIS — D631 Anemia in chronic kidney disease: Secondary | ICD-10-CM | POA: Diagnosis not present

## 2022-03-14 DIAGNOSIS — N183 Chronic kidney disease, stage 3 unspecified: Secondary | ICD-10-CM | POA: Diagnosis not present

## 2022-03-14 DIAGNOSIS — N3001 Acute cystitis with hematuria: Secondary | ICD-10-CM | POA: Diagnosis not present

## 2022-03-14 DIAGNOSIS — F0390 Unspecified dementia without behavioral disturbance: Secondary | ICD-10-CM | POA: Diagnosis not present

## 2022-03-14 LAB — BASIC METABOLIC PANEL
Anion gap: 7 (ref 5–15)
BUN: 28 mg/dL — ABNORMAL HIGH (ref 8–23)
CO2: 23 mmol/L (ref 22–32)
Calcium: 7.9 mg/dL — ABNORMAL LOW (ref 8.9–10.3)
Chloride: 107 mmol/L (ref 98–111)
Creatinine, Ser: 1.76 mg/dL — ABNORMAL HIGH (ref 0.61–1.24)
GFR, Estimated: 37 mL/min — ABNORMAL LOW (ref >60)
Glucose, Bld: 94 mg/dL (ref 70–99)
Potassium: 4.4 mmol/L (ref 3.5–5.1)
Sodium: 137 mmol/L (ref 135–145)

## 2022-03-14 MED ORDER — MIRTAZAPINE 15 MG PO TABS
7.5000 mg | ORAL_TABLET | Freq: Every day | ORAL | Status: DC
  Administered 2022-03-14 – 2022-03-17 (×4): 7.5 mg via ORAL
  Filled 2022-03-14 (×4): qty 1

## 2022-03-14 MED ORDER — TRAZODONE HCL 50 MG PO TABS
25.0000 mg | ORAL_TABLET | Freq: Every day | ORAL | Status: DC
Start: 2022-03-14 — End: 2022-03-15
  Administered 2022-03-14: 25 mg via ORAL
  Filled 2022-03-14: qty 1

## 2022-03-14 NOTE — TOC Progression Note (Signed)
Transition of Care (TOC) - Progression Note  ? ? ?Patient Details  ?Name: Jesse Cherry ?MRN: [id number] ?Date of Birth: May 14, 1933 ? ?Transition of Care (TOC) CM/SW Contact  ?Joanne Chars, LCSW ?Phone Number: ?03/14/2022, 2:36 PM ? ?Clinical Narrative:  Pt has not current bed offers.  CSW spoke with pt daughter [name] in the room.  She is requesting that referrals be sent to [facility] and to [facility].  She is also open to expanding search of other facilities in this area. ? ?Referral sent out to additional facilities. ?CSW spoke with Mechele Claude at [facility] [phone].Marland Kitchen  Their new name is Fish farm manager to: [email or web address]  ?CSW LM with Simona Huh at [facility] and faxed referral to him. ? ? ? ?Expected Discharge Plan: [place] ?Barriers to Discharge: Continued Medical Work up, SNF Pending bed offer ? ?Expected Discharge Plan and Services ?Expected Discharge Plan: [place] ?In-house Referral: Clinical Social Work ?  ?Post Acute Care Choice: [place] ?Living arrangements for the past 2 months: [place] ?                ?  ?  ?  ?  ?  ?  ?  ?  ?  ?  ? ? ?Social Determinants of Health (SDOH) Interventions ?  ? ?Readmission Risk Interventions ?   ? View : No data to display.  ?  ?  ?  ? ? ?

## 2022-03-14 NOTE — Progress Notes (Signed)
? ?[facility] ?PROGRESS NOTE ? ? ?Murel Bamba [id number]:[id number] DOB: 05-Jun-1933 DOA: 03/11/2022 ? ?PCP: Bailey Mech, PA-C ? ?Brief History/Interval Summary: 88 y.o. male with medical history significant of hip fracture, dementia, CKD, anemia, anxiety, BPH, neuropathy presented after a fall at his memory care unit. History provided with assistance of daughter and chart review due to patient's baseline dementia.  Apparently has had multiple falls recently.  Recently hospitalized for hip fracture after another fall.  He underwent surgical intervention.  Found to have a urinary tract infection.  Imaging studies of the head raise concern for displacement.  Orthopedics was consulted.   ? ?Consultants: Orthopedics ? ?Procedures: None ? ? ?Subjective/Interval History: ?Patient somnolent this morning.  Was reevaluated after a few hours.  Mentation has improved but apparently his sleep cycle is reversed.  His daughter is at the bedside  ? ? ?Assessment/Plan: ? ?Recurrent falls ?Patient has had numerous falls at his assisted living facility recently.  UTI may have contributed.  His dementia is likely playing a major role.  Seen by PT and OT.  Skilled nursing facility is recommended.  ? ?Acute UTI ?UA was noted to be abnormal.  Started on ceftriaxone.  Multiple species noted on urine culture.  We will continue ceftriaxone through today and then transition to oral agents tomorrow. ? ?Recent hip fracture ?Underwent right hip hemiarthroplasty on April 20.  Initial imaging studies raised concern for displacement.  CT was done.  Orthopedics was consulted.  They have reviewed the CT report.  No intervention is recommended.  Weight-bear as tolerated.  PT and OT evaluation.  Pain control. ?  ?Anxiety/Dementia ?Noted to be on multiple psychotropic agents including BuSpar, Depakote, Remeron, Zoloft, trazodone.  Polypharmacy could be contributing. ?Requiring sitter while in the hospital.  Episodes of agitation  noted. ?We will cut back on the dose of his Remeron and trazodone due to excessive somnolence. ? ?CKD3b ?Creatinine stable.  Slight improvement today after 24 hours of hydration.  Continue sodium bicarbonate.  Monitor urine output.   ?  ?Anemia of chronic kidney disease ?Stable hemoglobin noted.  Noted to be on iron supplements which is being continued. ? ? ?DVT Prophylaxis: Lovenox ?Code Status: DNR ?Family Communication: Discussed with daughter at bedside ?Disposition Plan: SNF ? ?Status is: Inpatient ?Remains inpatient appropriate because: Acute UTI causing worsening confusion with falls ? ? ? ? ? ?Medications: Scheduled: ? busPIRone  5 mg Oral QHS  ? divalproex  125 mg Oral BID  ? enoxaparin (LOVENOX) injection  30 mg Subcutaneous Q24H  ? ferrous sulfate  325 mg Oral Daily  ? mirtazapine  15 mg Oral QHS  ? sertraline  25 mg Oral Daily  ? sodium bicarbonate  650 mg Oral BID  ? sodium chloride flush  3 mL Intravenous Q12H  ? traZODone  50 mg Oral QHS  ? ?Continuous: ? cefTRIAXone (ROCEPHIN)  IV 1 g (03/13/22 2113)  ? ?VQX:[id number] **OR** acetaminophen, haloperidol, polyethylene glycol ? ?Antibiotics: ?Anti-infectives (From admission, onward)  ? ? Start     Dose/Rate Route Frequency Ordered Stop  ? 03/11/22 2000  cefTRIAXone (ROCEPHIN) 1 g in sodium chloride 0.9 % 100 mL IVPB       ? 1 g ?200 mL/hr over 30 Minutes Intravenous Every 24 hours 03/11/22 1935    ? 03/11/22 1815  sulfamethoxazole-trimethoprim (BACTRIM DS) 800-160 MG per tablet 1 tablet       ? 1 tablet Oral  Once 03/11/22 1807 03/11/22 1827  ? ?  ? ? ?  Objective: ? ?Vital Signs ? ?Vitals:  ? 03/13/22 1501 03/13/22 2028 03/14/22 0701 03/14/22 0900  ?BP: 126/73 109/66 92/64 113/63  ?Pulse: 66 83 60 63  ?Resp: 16 18 18 18   ?Temp: 97.8 ?F (36.6 ?C) 98.6 ?F (37 ?C)  98.8 ?F (37.1 ?C)  ?TempSrc: Oral Oral  Oral  ?SpO2: 98% 100% 96% 100%  ?Weight:      ?Height:      ? ? ?Intake/Output Summary (Last 24 hours) at 03/14/2022 0939 ?Last data filed at 03/13/2022  1700 ?Gross per 24 hour  ?Intake 0.9 ml  ?Output 410 ml  ?Net -409.1 ml  ? ? ?Filed Weights  ? 03/11/22 1323  ?Weight: 74.7 kg  ? ? ?General appearance: Somnolent today but arousable.  In no distress ?Resp: Clear to auscultation bilaterally.  Normal effort ?Cardio: S1-S2 is normal regular.  No S3-S4.  No rubs murmurs or bruit ?GI: Abdomen is soft.  Nontender nondistended.  Bowel sounds are present normal.  No masses organomegaly ? ? ? ?Lab Results: ? ?Data Reviewed: I have personally reviewed following labs and reports of the imaging studies ? ?CBC: ?Recent Labs  ?Lab 03/08/22 ?0125 03/09/22 ?0157 03/11/22 ?1600 03/12/22 ?0225 03/13/22 ?0434  ?WBC 9.9 9.2 12.1* 11.1* 12.7*  ?NEUTROABS  --   --  8.6*  --   --   ?HGB 7.6* 7.7* 9.7* 8.9* 9.3*  ?HCT 24.0* 24.1* 30.0* 27.5* 28.7*  ?MCV 97.2 98.0 97.7 97.2 96.3  ?PLT 235 256 319 328 344  ? ? ? ?Basic Metabolic Panel: ?Recent Labs  ?Lab 03/09/22 ?0157 03/11/22 ?1600 03/12/22 ?0225 03/13/22 ?0434 03/14/22 ?0220  ?NA 139 138 140 139 137  ?K 4.3 4.8 4.4 4.6 4.4  ?CL 109 106 110 107 107  ?CO2 24 22 23 23 23   ?GLUCOSE 79 99 77 82 94  ?BUN 38* 38* 36* 35* 28*  ?CREATININE 1.77* 1.80* 1.82* 1.98* 1.76*  ?CALCIUM 7.9* 8.1* 8.1* 8.2* 7.9*  ? ? ? ?GFR: ?Estimated Creatinine Clearance: 28.2 mL/min (A) (by C-G formula based on SCr of 1.76 mg/dL (H)). ? ?Liver Function Tests: ?Recent Labs  ?Lab 03/11/22 ?1600 03/12/22 ?0225  ?AST 16 12*  ?ALT 7 8  ?ALKPHOS 61 58  ?BILITOT 0.7 0.6  ?PROT 6.3* 5.9*  ?ALBUMIN 2.7* 2.4*  ? ? ? ?Recent Results (from the past 240 hour(s))  ?MRSA Next Gen by PCR, Nasal     Status: None  ? Collection Time: 03/05/22  7:10 PM  ? Specimen: Nasal Mucosa; Nasal Swab  ?Result Value Ref Range Status  ? MRSA by PCR Next Gen NOT DETECTED NOT DETECTED Final  ?  Comment: (NOTE) ?The GeneXpert MRSA Assay (FDA approved for NASAL specimens only), ?is one component of a comprehensive MRSA colonization surveillance ?program. It is not intended to diagnose MRSA infection nor  to guide ?or monitor treatment for MRSA infections. ?Test performance is not FDA approved in patients less than 2 years ?old. ?Performed at [facility], [address] [place], [place] ?[id number] ?  ?Urine Culture     Status: Abnormal  ? Collection Time: 03/11/22  3:27 PM  ? Specimen: Urine, Clean Catch  ?Result Value Ref Range Status  ? Specimen Description URINE, CLEAN CATCH  Final  ? Special Requests   Final  ?  NONE ?Performed at [facility], [address] [place], [place] [address] ?  ? Culture MULTIPLE SPECIES PRESENT, SUGGEST RECOLLECTION (A)  Final  ? Report Status 03/12/2022 FINAL  Final  ?Culture, blood (routine x 2)     Status: None (Preliminary result)  ? Collection Time: 03/11/22  6:54 PM  ? Specimen: BLOOD  ?Result Value Ref Range Status  ? Specimen Description BLOOD SITE NOT SPECIFIED  Final  ? Special Requests   Final  ?  BOTTLES DRAWN AEROBIC AND ANAEROBIC Blood Culture results may not be optimal due to an inadequate volume of blood received in culture bottles  ? Culture   Final  ?  NO GROWTH 3 DAYS ?Performed at [facility], [address], [place], [place] [address] ?  ? Report Status PENDING  Incomplete  ? ?  ? ? ?Radiology Studies: ?No results found. ? ? ? ? LOS: 1 day  ? ?Osvaldo Shipper ? ?[facility] ?Pager on [email or web address] ? ?03/14/2022, 9:39 AM ? ? ?

## 2022-03-14 NOTE — Progress Notes (Signed)
OT Cancellation Note ? ?Patient Details ?Name: Jesse Cherry ?MRN: [id number] ?DOB: 1933-06-25 ? ? ?Cancelled Treatment:    Reason Eval/Treat Not Completed: Fatigue/lethargy limiting ability to participate- family reports pt was up all night, pt voicing "let me sleep".  Encouraged day/night cycle with family. Will check back today as able.  ? ?Jolaine Artist, OT ?Acute Rehabilitation Services ?Pager [phone] ?Office [phone] ? ? ?[name] ?03/14/2022, 10:39 AM ?

## 2022-03-14 NOTE — Plan of Care (Signed)

## 2022-03-14 NOTE — Progress Notes (Signed)
MC 5N14 Civil engineer, contracting [facility]) Hospital Liaison RN note    ?  ?This is a current [facility] hospice patient from [facility] with a terminal diagnosis of protein calorie malnutrition. He was transported to ED for evaluation following a fall. He  was admitted to the hospital on 03/11/22 for pain management and evaluation of previous femur fracture. Per Dr. Kirt Boys, an [facility] Physician,  this is a related hospital admission.  ?  ?This patient is appropriate for inpatient hospice care for symptom management, IV antibiotics/fluids and safe discharge plan.  ?  ?Visited at bedside with daughter,Crystal present. Patient sleeping and does not appear in distress or pain. Daughter states patient did not want to work with PT today, asked to sleep. He seems to have his days/nights mixed up as he is more awake at night. ? ?VS: 98.8, 113/63, 63, 18, 100% RA ?I/O: 60.9/410 ? ?Abnormal Labs: ?03/14/22 02:20 ?BUN: 28 (H) ?Creatinine: 1.76 (H) ?Calcium: 7.9 (L) ?GFR, Estimated: 37 (L) ? ?No new diagnostics. ? ?IV/PRN Medications: Rocephin 1g IVPB QD, Haldol 0.5mg  PRN @ 0026 ? ?Problem list: ?Assessment/Plan: ?Recurrent falls ?Patient has had numerous falls at his assisted living facility recently.  UTI may have contributed.  His dementia is likely playing a major role.  Seen by PT and OT.  Skilled nursing facility is recommended.  ?  ?Acute UTI ?UA was noted to be abnormal.  Started on ceftriaxone.  Multiple species noted on urine culture.  We will continue ceftriaxone through today and then transition to oral agents tomorrow. ?  ?Recent hip fracture ?Underwent right hip hemiarthroplasty on April 20.  Initial imaging studies raised concern for displacement.  CT was done.  Orthopedics was consulted.  They have reviewed the CT report.  No intervention is recommended.  Weight-bear as tolerated.  PT and OT evaluation.  Pain control. ?  ?Anxiety/Dementia ?Noted to be on multiple psychotropic agents including BuSpar,  Depakote, Remeron, Zoloft, trazodone.  Polypharmacy could be contributing. ?Requiring sitter while in the hospital.  Episodes of agitation noted. ?We will cut back on the dose of his Remeron and trazodone due to excessive somnolence. ? ?CKD3b ?Creatinine stable.  Slight improvement today after 24 hours of hydration.  Continue sodium bicarbonate.  Monitor urine output.   ?  ?Anemia of chronic kidney disease ?Stable hemoglobin noted.  Noted to be on iron supplements which is being continued. ?  ?Discharge planning: ongoing, evaluation for SNF for rehab.  ?Family contact: spoke with daughter in room ?IDG: Updated  ?GOC: Clear - DNR ?  ?Should patient need ambulance transport at discharge please use [facility] as [facility] contracts this service with them for our active hospice patients. ?  ?Please do not hesitate to call with hospice related questions/concerns, ?  ?Thank you,    ?  ?Elsie Saas, RN, CCM       ?[facility] Hospital Liaison    ?[phone]  ?  ?

## 2022-03-14 NOTE — Progress Notes (Signed)
Physical Therapy Treatment ?Patient Details ?Name: Jesse Cherry ?MRN: [id number] ?DOB: 09-Feb-1933 ?Today's Date: 03/14/2022 ? ? ?History of Present Illness Pt is an 88 y.o. male presenting to [facility] ED 03/11/22 after sustaining falls at his memory care unit and has progressive weakness. Recent admission to [facility] long ED. Imaging (+) for comminuted fx of the R greater trochanter with orthopedic surgeon clearing pt for mobility, WBAT. +UTI. PMH includes advanced dementia, CKD, L THA, and recent R hemiarthroplasty (03/06/22). ? ?  ?PT Comments  ? ? Pt received in bed awake having just finished lunch. With encouragement agreeable to OOB. Pt min a +2 for bed mobility, mod a +2 to come to standing from EOB down to min a +2 to stand from elevated EOB. Pt max a +2 to step pivot transfer to recliner with pt advancing RLE but difficulty shifting weight to R and advancing LLE.  Pt remains limited by confusion, pain, balance and weakness. Pt demonstrating decreased safety awareness with attempts to sit prior to reaching recliner.  Educated Charity fundraiser on use of stedy for transfer back to bed.  Family present and supportive during session. Current plan remains appropriate to address deficits and maximize functional independence and decrease caregiver burden. Pt continues to benefit from skilled PT services to progress toward functional mobility goals.   ?  ?Recommendations for follow up therapy are one component of a multi-disciplinary discharge planning process, led by the attending physician.  Recommendations may be updated based on patient status, additional functional criteria and insurance authorization. ? ?Follow Up Recommendations ? Skilled nursing-short term rehab (<3 hours/day) ?  ?  ?Assistance Recommended at Discharge Frequent or constant Supervision/Assistance  ?Patient can return home with the following Two people to help with walking and/or transfers;Two people to help with bathing/dressing/bathroom;Assistance with  cooking/housework;Assistance with feeding;Direct supervision/assist for medications management;Direct supervision/assist for financial management;Assist for transportation;Help with stairs or ramp for entrance ?  ?Equipment Recommendations ? Other (comment) (TBD at next venue of care)  ?  ?Recommendations for Other Services   ? ? ?  ?Precautions / Restrictions Precautions ?Precautions: Fall ?Restrictions ?Weight Bearing Restrictions: Yes ?RLE Weight Bearing: Weight bearing as tolerated  ?  ? ?Mobility ? Bed Mobility ?Overal bed mobility: Needs Assistance ?Bed Mobility: Supine to Sit ?  ?  ?Supine to sit: Min assist, +2 for physical assistance, +2 for safety/equipment, HOB elevated ?  ?  ?General bed mobility comments: min assist for trunk and LB support, mulitmodal cueing to initate task ?  ? ?Transfers ?Overall transfer level: Needs assistance ?Equipment used: Rolling walker (2 wheels) ?Transfers: Sit to/from Stand, Bed to chair/wheelchair/BSC ?Sit to Stand: Mod assist, Min assist, +2 safety/equipment, +2 physical assistance, From elevated surface ?  ?Step pivot transfers: Max assist, +2 physical assistance, +2 safety/equipment ?  ?  ?  ?General transfer comment: min assist +2 to ascend from elevated EOB (mod from low bed), step pivot to recliner towards R side with poor ability to weightshift onto R LE and step L LE forward.  Pt prematurely sitting and requires max assist to pivot hips into chair. ?  ? ?Ambulation/Gait ?  ?  ?  ?  ?  ?  ?  ?  ? ? ?Stairs ?  ?  ?  ?  ?  ? ? ?Wheelchair Mobility ?  ? ?Modified Rankin (Stroke Patients Only) ?  ? ? ?  ?Balance Overall balance assessment: Needs assistance ?Sitting-balance support: Feet unsupported, No upper extremity supported ?Sitting balance-Leahy Scale: Fair ?Sitting balance -  Comments: supervision sitting statically ?  ?Standing balance support: Bilateral upper extremity supported, During functional activity ?Standing balance-Leahy Scale: Poor ?Standing balance  comment: reliant on BUE support and external assist ?  ?  ?  ?  ?  ?  ?  ?  ?  ?  ?  ?  ? ?  ?Cognition Arousal/Alertness: Awake/alert ?Behavior During Therapy: Anxious ?Overall Cognitive Status: History of cognitive impairments - at baseline ?  ?  ?  ?  ?  ?  ?  ?  ?  ?  ?  ?  ?  ?  ?  ?  ?General Comments: poor sequencing and safety awareness ?  ?  ? ?  ?Exercises General Exercises - Lower Extremity ?Ankle Circles/Pumps: AROM, AAROM, Both, 5 reps ?Heel Slides: AROM, AAROM, Right, Left, 10 reps ?Hip Flexion/Marching: AROM, AAROM, Right, Left, 10 reps, Supine ? ?  ?General Comments General comments (skin integrity, edema, etc.): daughter present ?  ?  ? ?Pertinent Vitals/Pain Pain Assessment ?Pain Assessment: Faces ?Faces Pain Scale: Hurts even more ?Pain Location: generalized with movement (anticipate R hip but pt did not state location) ?Pain Descriptors / Indicators: Discomfort, Grimacing, Guarding, Moaning  ? ? ?Home Living   ?  ?  ?  ?  ?  ?  ?  ?  ?  ?   ?  ?Prior Function    ?  ?  ?   ? ?PT Goals (current goals can now be found in the care plan section) Acute Rehab PT Goals ?PT Goal Formulation: With family ?Time For Goal Achievement: 03/26/22 ? ?  ?Frequency ? ? ? Min 2X/week ? ? ? ?  ?PT Plan    ? ? ?Co-evaluation PT/OT/SLP Co-Evaluation/Treatment: Yes ?Reason for Co-Treatment: Necessary to address cognition/behavior during functional activity;For patient/therapist safety;To address functional/ADL transfers ?PT goals addressed during session: Mobility/safety with mobility;Proper use of DME;Balance ?OT goals addressed during session: ADL's and self-care ?  ? ?  ?AM-PAC PT "6 Clicks" Mobility   ?Outcome Measure ? Help needed turning from your back to your side while in a flat bed without using bedrails?: A Little ?Help needed moving from lying on your back to sitting on the side of a flat bed without using bedrails?: Total ?Help needed moving to and from a bed to a chair (including a wheelchair)?: A Lot ?Help  needed standing up from a chair using your arms (e.g., wheelchair or bedside chair)?: Total ?Help needed to walk in hospital room?: Total ?Help needed climbing 3-5 steps with a railing? : Total ?6 Click Score: 9 ? ?  ?End of Session Equipment Utilized During Treatment: Gait belt ?Activity Tolerance: Patient tolerated treatment well ?Patient left: in chair;with call bell/phone within reach;with chair alarm set ?Nurse Communication: Mobility status ?PT Visit Diagnosis: Unsteadiness on feet (R26.81);Other abnormalities of gait and mobility (R26.89);Repeated falls (R29.6);Muscle weakness (generalized) (M62.81);History of falling (Z91.81);Difficulty in walking, not elsewhere classified (R26.2);Pain ?Pain - Right/Left: Right ?Pain - part of body: Hip ?  ? ? ?Time: 0354-6568 ?PT Time Calculation (min) (ACUTE ONLY): 20 min ? ?Charges:  $Therapeutic Activity: 8-22 mins          ?          ? ?Lenora Boys. PTA ?Acute Rehabilitation Services ?Office: [phone] ? ? ? ?Jesse Cherry ?03/14/2022, 3:37 PM ? ?

## 2022-03-14 NOTE — Progress Notes (Addendum)
Occupational Therapy Treatment ?Patient Details ?Name: Jesse Cherry ?MRN: [id number] ?DOB: 03/19/1933 ?Today's Date: 03/14/2022 ? ? ?History of present illness Pt is an 88 y.o. male presenting to [facility] ED 03/11/22 after sustaining falls at his memory care unit and has progressive weakness. Recent admission to [facility] long ED. Imaging (+) for comminuted fx of the R greater trochanter with orthopedic surgeon clearing pt for mobility, WBAT. +UTI. PMH includes advanced dementia, CKD, L THA, and recent R hemiarthroplasty (03/06/22). ?  ?OT comments ? Pt in bed, awake and just finished lunch.  He requires encouragement for OOB activity, but agreeable.  Completing bed mobility with min assist +2, total assist for LB ADLs. sit to stand from elevated EOB with min assist +2 and utilized RW with max assist +2 to step pivot to recliner. In recliner, completed grooming with min assist. Pt remains limited by confusion, pain, balance and weakness. Decreased safety awareness and attempts to sit prior to reaching recliner.  Educated Therapist, sports on use of stedy for transfer back to bed.  Family present and supportive during session.  Continue to recommend SNF.  Will follow acutely.   ? ?Recommendations for follow up therapy are one component of a multi-disciplinary discharge planning process, led by the attending physician.  Recommendations may be updated based on patient status, additional functional criteria and insurance authorization. ?   ?Follow Up Recommendations ? Skilled nursing-short term rehab (<3 hours/day)  ?  ?Assistance Recommended at Discharge Frequent or constant Supervision/Assistance  ?Patient can return home with the following ? A lot of help with walking and/or transfers;A lot of help with bathing/dressing/bathroom;Assistance with cooking/housework;Direct supervision/assist for medications management;Help with stairs or ramp for entrance;Assist for transportation;Direct supervision/assist for financial management ?  ?Equipment  Recommendations ? Other (comment) (defer)  ?  ?Recommendations for Other Services   ? ?  ?Precautions / Restrictions Precautions ?Precautions: Fall ?Restrictions ?Weight Bearing Restrictions: Yes ?RLE Weight Bearing: Weight bearing as tolerated  ? ? ?  ? ?Mobility Bed Mobility ?Overal bed mobility: Needs Assistance ?Bed Mobility: Supine to Sit ?  ?  ?Supine to sit: Min assist, +2 for physical assistance, +2 for safety/equipment, HOB elevated ?  ?  ?General bed mobility comments: min assist for trunk and LB support, mulitmodal cueing to initate task ?  ? ?Transfers ?Overall transfer level: Needs assistance ?Equipment used: Rolling walker (2 wheels) ?Transfers: Sit to/from Stand, Bed to chair/wheelchair/BSC ?Sit to Stand: Mod assist, Min assist, +2 safety/equipment, +2 physical assistance, From elevated surface ?  ?  ?Step pivot transfers: Max assist, +2 physical assistance, +2 safety/equipment ?  ?  ?General transfer comment: min assist +2 to ascend from elevated EOB (mod from low bed), step pivot to recliner towards R side with poor ability to weightshift onto R LE and step L LE forward.  Pt prematurely sitting and requires max assist to pivot hips into chair. ?  ?  ?Balance Overall balance assessment: Needs assistance ?Sitting-balance support: Feet unsupported, No upper extremity supported ?Sitting balance-Leahy Scale: Fair ?Sitting balance - Comments: supervision sitting statically ?  ?Standing balance support: Bilateral upper extremity supported, During functional activity ?Standing balance-Leahy Scale: Poor ?Standing balance comment: reliant on BUE support and external assist ?  ?  ?  ?  ?  ?  ?  ?  ?  ?  ?  ?   ? ?ADL either performed or assessed with clinical judgement  ? ?ADL Overall ADL's : Needs assistance/impaired ?Eating/Feeding: Set up;Sitting ?Eating/Feeding Details (indicate cue type and reason):  per family and NT, setup to eat lunch prior to session ?Grooming: Minimal assistance;Sitting ?Grooming  Details (indicate cue type and reason): to brush teeth, cueing to sequence and encouragement to complete task without assist ?  ?  ?  ?  ?  ?  ?Lower Body Dressing: Total assistance;+2 for physical assistance;+2 for safety/equipment;Sit to/from stand ?  ?Toilet Transfer: Maximal assistance;+2 for physical assistance;+2 for safety/equipment;Rolling walker (2 wheels) ?Toilet Transfer Details (indicate cue type and reason): step pivot simulated to recliner ?  ?  ?  ?  ?  ?General ADL Comments: limited by cognition, pain and weakness ?  ? ?Extremity/Trunk Assessment   ?  ?  ?  ?  ?  ? ?Vision   ?  ?  ?Perception   ?  ?Praxis   ?  ? ?Cognition Arousal/Alertness: Awake/alert ?Behavior During Therapy: Anxious ?Overall Cognitive Status: History of cognitive impairments - at baseline ?  ?  ?  ?  ?  ?  ?  ?  ?  ?  ?  ?  ?  ?  ?  ?  ?General Comments: poor sequencing and safety awareness ?  ?  ?   ?Exercises   ? ?  ?Shoulder Instructions   ? ? ?  ?General Comments daughter present  ? ? ?Pertinent Vitals/ Pain       Pain Assessment ?Pain Assessment: Faces ?Faces Pain Scale: Hurts even more ?Pain Location: generalized with movement (anticipate R hip but pt did not state location) ?Pain Descriptors / Indicators: Discomfort, Grimacing, Guarding, Moaning ?Pain Intervention(s): Limited activity within patient's tolerance, Monitored during session, Repositioned ? ?Home Living   ?  ?  ?  ?  ?  ?  ?  ?  ?  ?  ?  ?  ?  ?  ?  ?  ?  ?  ? ?  ?Prior Functioning/Environment    ?  ?  ?  ?   ? ?Frequency ? Min 2X/week  ? ? ? ? ?  ?Progress Toward Goals ? ?OT Goals(current goals can now be found in the care plan section) ? Progress towards OT goals: Progressing toward goals ? ?Acute Rehab OT Goals ?Patient Stated Goal: not stated ?OT Goal Formulation: With patient ?Time For Goal Achievement: 03/26/22 ?Potential to Achieve Goals: Good  ?Plan Discharge plan remains appropriate;Frequency remains appropriate   ? ?Co-evaluation ? ? ? PT/OT/SLP  Co-Evaluation/Treatment: Yes ?Reason for Co-Treatment: Necessary to address cognition/behavior during functional activity;To address functional/ADL transfers;For patient/therapist safety ?  ?OT goals addressed during session: ADL's and self-care ?  ? ?  ?AM-PAC OT "6 Clicks" Daily Activity     ?Outcome Measure ? ? Help from another person eating meals?: A Little ?Help from another person taking care of personal grooming?: A Little ?Help from another person toileting, which includes using toliet, bedpan, or urinal?: Total ?Help from another person bathing (including washing, rinsing, drying)?: A Lot ?Help from another person to put on and taking off regular upper body clothing?: A Lot ?Help from another person to put on and taking off regular lower body clothing?: Total ?6 Click Score: 12 ? ?  ?End of Session Equipment Utilized During Treatment: Rolling walker (2 wheels);Gait belt ? ?OT Visit Diagnosis: Unsteadiness on feet (R26.81);Muscle weakness (generalized) (M62.81);Other symptoms and signs involving cognitive function;History of falling (Z91.81) ?  ?Activity Tolerance Patient tolerated treatment well ?  ?Patient Left with call bell/phone within reach;in chair;with chair alarm set;with family/visitor present ?  ?Nurse Communication  Mobility status ?  ? ?   ? ?Time: [id number] ?OT Time Calculation (min): 19 min ? ?Charges: OT General Charges ?$OT Visit: 1 Visit (visit only- pt unable to tolerate more therapy) ? ?Jolaine Artist, OT ?Acute Rehabilitation Services ?Pager [phone] ?Office [phone] ? ? ?[name] ?03/14/2022, 1:41 PM ?

## 2022-03-15 LAB — BASIC METABOLIC PANEL
Anion gap: 4 — ABNORMAL LOW (ref 5–15)
BUN: 23 mg/dL (ref 8–23)
CO2: 24 mmol/L (ref 22–32)
Calcium: 8 mg/dL — ABNORMAL LOW (ref 8.9–10.3)
Chloride: 108 mmol/L (ref 98–111)
Creatinine, Ser: 1.85 mg/dL — ABNORMAL HIGH (ref 0.61–1.24)
GFR, Estimated: 35 mL/min — ABNORMAL LOW (ref >60)
Glucose, Bld: 90 mg/dL (ref 70–99)
Potassium: 4.6 mmol/L (ref 3.5–5.1)
Sodium: 136 mmol/L (ref 135–145)

## 2022-03-15 MED ORDER — CEPHALEXIN 250 MG PO CAPS
250.0000 mg | ORAL_CAPSULE | Freq: Three times a day (TID) | ORAL | Status: DC
  Administered 2022-03-15 – 2022-03-18 (×9): 250 mg via ORAL
  Filled 2022-03-15 (×11): qty 1

## 2022-03-15 MED ORDER — TRAZODONE HCL 50 MG PO TABS
50.0000 mg | ORAL_TABLET | Freq: Every day | ORAL | Status: DC
Start: 2022-03-15 — End: 2022-03-18
  Administered 2022-03-15 – 2022-03-17 (×3): 50 mg via ORAL
  Filled 2022-03-15 (×3): qty 1

## 2022-03-15 NOTE — Progress Notes (Addendum)
MC 5N14 Civil engineer, contracting [facility]) Hospital Liaison RN note    ?  ?This is a current [facility] hospice patient from [facility] with a terminal diagnosis of protein calorie malnutrition. He was transported to ED for evaluation following a fall. He  was admitted to the hospital on 03/11/22 for pain management and evaluation of previous femur fracture. Per Dr. Kirt Boys, an [facility] Physician,  this is a related hospital admission.  ?  ?This patient is appropriate for inpatient care for symptom management, PT/OT evaluation and safe discharge plan.  ? ?Patient sitting up in bed with daughter assisting with lunch.  He is more alert today. Spoke with daughters in room. Continue to work on placement.  ? ?VS: 98.3, 98/44, 71, 20, 93% RA ?I/O: 350/400 ? ?Abnormal Labs: ?Creatinine: 1.85 (H) ?Calcium: 8.0 (L) ?Anion gap: 4 (L) ?GFR, Estimated: 35 (L) ? ?No new diagnostics.  ? ?IV/PRN Medications: Tylenol 650mg  PRN @ 0329, ? ?Problem list: ?Assessment/Plan: ?  ?Recurrent falls ?Patient has had numerous falls at his assisted living facility recently.  UTI may have contributed.  His dementia is likely playing a major role.  Seen by PT and OT.  Skilled nursing facility is recommended.  ?  ?Acute UTI ?UA was noted to be abnormal.  Started on ceftriaxone.  Multiple species noted on urine culture.  Change ceftriaxone to Keflex this morning.  Will treat for total 7 days. ?  ?Recent hip fracture ?Underwent right hip hemiarthroplasty on April 20.  Initial imaging studies raised concern for displacement.  CT was done.  Orthopedics was consulted.  They have reviewed the CT report.  No intervention is recommended.  Weight-bear as tolerated.  PT and OT following.  Pain seems to be reasonably well controlled. ?  ?Anxiety/Dementia ?Noted to be on multiple psychotropic agents including BuSpar, Depakote, Remeron, Zoloft, trazodone.  Polypharmacy could be contributing. ?Requiring sitter while in the hospital.  Yesterday he was  noted to be significantly somnolent.  We did decrease his dose of Remeron and trazodone.  Noted to be awake and alert this morning.  Continue to monitor. ? ?CKD3b ?Required hydration for 24 hours.  Creatinine noted to be stable for the most part.  Monitor urine output.  Continue sodium bicarbonate.    ?  ?Anemia of chronic kidney disease ?Stable hemoglobin noted.  Noted to be on iron supplements which is being continued. ?  ?DVT Prophylaxis: Lovenox ?Code Status: DNR ?Family Communication: No family at bedside ?Disposition Plan: Waiting on SNF ?  ?Status is: Inpatient ?Remains inpatient appropriate because: Acute UTI causing worsening confusion with falls ? ?Discharge planning: ongoing, evaluation for SNF for rehab.  ?Family contact: spoke with daughter in room ?IDG: Updated  ?GOC: Clear - DNR ?  ?Should patient need ambulance transport at discharge please use [facility] as [facility] contracts this service with them for our active hospice patients. ?  ?Please do not hesitate to call with hospice related questions/concerns, ?  ?Thank you,    ?  ?[facility], RN, CCM       ?[facility] Hospital Liaison    ?336- [facility]  ?  ?

## 2022-03-15 NOTE — Progress Notes (Signed)
? ?[facility] ?PROGRESS NOTE ? ? ?Jesse Cherry [id number] DOB: 1933-04-09 DOA: 03/11/2022 ? ?PCP: Jonathon Bellows, PA-C ? ?Brief History/Interval Summary: 88 y.o. male with medical history significant of hip fracture, dementia, CKD, anemia, anxiety, BPH, neuropathy presented after a fall at his memory care unit. History provided with assistance of daughter and chart review due to patient's baseline dementia.  Apparently has had multiple falls recently.  Recently hospitalized for hip fracture after another fall.  He underwent surgical intervention.  Found to have a urinary tract infection.  Imaging studies of the head raise concern for displacement.  Orthopedics was consulted.   ? ?Consultants: Orthopedics ? ?Procedures: None ? ? ?Subjective/Interval History: ?Patient noted to be awake alert this morning.  Confused.  Not answering questions.  However he is very hard of hearing.   ? ? ?Assessment/Plan: ? ?Recurrent falls ?Patient has had numerous falls at his assisted living facility recently.  UTI may have contributed.  His dementia is likely playing a major role.  Seen by PT and OT.  Skilled nursing facility is recommended.  ? ?Acute UTI ?UA was noted to be abnormal.  Started on ceftriaxone.  Multiple species noted on urine culture.  Change ceftriaxone to Keflex this morning.  Will treat for total 7 days. ? ?Recent hip fracture ?Underwent right hip hemiarthroplasty on April 20.  Initial imaging studies raised concern for displacement.  CT was done.  Orthopedics was consulted.  They have reviewed the CT report.  No intervention is recommended.  Weight-bear as tolerated.  PT and OT following.  Pain seems to be reasonably well controlled. ?  ?Anxiety/Dementia ?Noted to be on multiple psychotropic agents including BuSpar, Depakote, Remeron, Zoloft, trazodone.  Polypharmacy could be contributing. ?Requiring sitter while in the hospital.  Yesterday he was noted to be significantly somnolent.  We did  decrease his dose of Remeron and trazodone.  Noted to be awake and alert this morning.  Continue to monitor. ? ?CKD3b ?Required hydration for 24 hours.  Creatinine noted to be stable for the most part.  Monitor urine output.  Continue sodium bicarbonate.    ?  ?Anemia of chronic kidney disease ?Stable hemoglobin noted.  Noted to be on iron supplements which is being continued. ? ? ?DVT Prophylaxis: Lovenox ?Code Status: DNR ?Family Communication: No family at bedside ?Disposition Plan: Waiting on SNF ? ?Status is: Inpatient ?Remains inpatient appropriate because: Acute UTI causing worsening confusion with falls ? ? ? ? ? ?Medications: Scheduled: ? busPIRone  5 mg Oral QHS  ? divalproex  125 mg Oral BID  ? enoxaparin (LOVENOX) injection  30 mg Subcutaneous Q24H  ? ferrous sulfate  325 mg Oral Daily  ? mirtazapine  7.5 mg Oral QHS  ? sertraline  25 mg Oral Daily  ? sodium bicarbonate  650 mg Oral BID  ? sodium chloride flush  3 mL Intravenous Q12H  ? traZODone  25 mg Oral QHS  ? ?Continuous: ? cefTRIAXone (ROCEPHIN)  IV 1 g (03/14/22 2022)  ? ?[id number] **OR** acetaminophen, haloperidol, polyethylene glycol ? ?Antibiotics: ?Anti-infectives (From admission, onward)  ? ? Start     Dose/Rate Route Frequency Ordered Stop  ? 03/11/22 2000  cefTRIAXone (ROCEPHIN) 1 g in sodium chloride 0.9 % 100 mL IVPB       ? 1 g ?200 mL/hr over 30 Minutes Intravenous Every 24 hours 03/11/22 1935    ? 03/11/22 1815  sulfamethoxazole-trimethoprim (BACTRIM DS) 800-160 MG per tablet 1 tablet       ?  1 tablet Oral  Once 03/11/22 1807 03/11/22 1827  ? ?  ? ? ?Objective: ? ?Vital Signs ? ?Vitals:  ? 03/14/22 0900 03/14/22 1545 03/14/22 2016 03/15/22 0508  ?BP: 113/63 (!) 99/55 102/61 (!) 98/44  ?Pulse: 63 61 66 77  ?Resp: 18  20 20   ?Temp: 98.8 ?F (37.1 ?C) 97.9 ?F (36.6 ?C) 98.2 ?F (36.8 ?C) 98.3 ?F (36.8 ?C)  ?TempSrc: Oral Oral Oral Oral  ?SpO2: 100% 96% 97% 93%  ?Weight:      ?Height:      ? ? ?Intake/Output Summary (Last 24 hours)  at 03/15/2022 0931 ?Last data filed at 03/15/2022 0300 ?Gross per 24 hour  ?Intake 240 ml  ?Output 750 ml  ?Net -510 ml  ? ? ?Filed Weights  ? 03/11/22 1323  ?Weight: 74.7 kg  ? ? ?General appearance: Awake alert.  In no distress.  Distracted ?Resp: Clear to auscultation bilaterally.  Normal effort ?Cardio: S1-S2 is normal regular.  No S3-S4.  No rubs murmurs or bruit ?GI: Abdomen is soft.  Nontender nondistended.  Bowel sounds are present normal.  No masses organomegaly ?Extremities: No edema.   ?No obvious focal neurological deficits. ? ? ? ?Lab Results: ? ?Data Reviewed: I have personally reviewed following labs and reports of the imaging studies ? ?CBC: ?Recent Labs  ?Lab 03/09/22 ?0157 03/11/22 ?1600 03/12/22 ?0225 03/13/22 ?0434  ?WBC 9.2 12.1* 11.1* 12.7*  ?NEUTROABS  --  8.6*  --   --   ?HGB 7.7* 9.7* 8.9* 9.3*  ?HCT 24.1* 30.0* 27.5* 28.7*  ?MCV 98.0 97.7 97.2 96.3  ?PLT 256 319 328 344  ? ? ? ?Basic Metabolic Panel: ?Recent Labs  ?Lab 03/11/22 ?1600 03/12/22 ?0225 03/13/22 ?0434 03/14/22 ?0220 03/15/22 ?0137  ?NA 138 140 139 137 136  ?K 4.8 4.4 4.6 4.4 4.6  ?CL 106 110 107 107 108  ?CO2 22 23 23 23 24   ?GLUCOSE 99 77 82 94 90  ?BUN 38* 36* 35* 28* 23  ?CREATININE 1.80* 1.82* 1.98* 1.76* 1.85*  ?CALCIUM 8.1* 8.1* 8.2* 7.9* 8.0*  ? ? ? ?GFR: ?Estimated Creatinine Clearance: 26.8 mL/min (A) (by C-G formula based on SCr of 1.85 mg/dL (H)). ? ?Liver Function Tests: ?Recent Labs  ?Lab 03/11/22 ?1600 03/12/22 ?0225  ?AST 16 12*  ?ALT 7 8  ?ALKPHOS 61 58  ?BILITOT 0.7 0.6  ?PROT 6.3* 5.9*  ?ALBUMIN 2.7* 2.4*  ? ? ? ?Recent Results (from the past 240 hour(s))  ?MRSA Next Gen by PCR, Nasal     Status: None  ? Collection Time: 03/05/22  7:10 PM  ? Specimen: Nasal Mucosa; Nasal Swab  ?Result Value Ref Range Status  ? MRSA by PCR Next Gen NOT DETECTED NOT DETECTED Final  ?  Comment: (NOTE) ?The GeneXpert MRSA Assay (FDA approved for NASAL specimens only), ?is one component of a comprehensive MRSA colonization  surveillance ?program. It is not intended to diagnose MRSA infection nor to guide ?or monitor treatment for MRSA infections. ?Test performance is not FDA approved in patients less than 2 years ?old. ?Performed at [facility], [place] [address], [place], [place] ?[address] ?  ?Urine Culture     Status: Abnormal  ? Collection Time: 03/11/22  3:27 PM  ? Specimen: Urine, Clean Catch  ?Result Value Ref Range Status  ? Specimen Description URINE, CLEAN CATCH  Final  ? Special Requests   Final  ?  NONE ?Performed at [facility], [place] [address] [place], [place]  [address] ?  ? Culture MULTIPLE SPECIES PRESENT, SUGGEST RECOLLECTION (A)  Final  ? Report Status 03/12/2022 FINAL  Final  ?Culture, blood (routine x 2)     Status: None (Preliminary result)  ? Collection Time: 03/11/22  6:54 PM  ? Specimen: BLOOD  ?Result Value Ref Range Status  ? Specimen Description BLOOD SITE NOT SPECIFIED  Final  ? Special Requests   Final  ?  BOTTLES DRAWN AEROBIC AND ANAEROBIC Blood Culture results may not be optimal due to an inadequate volume of blood received in culture bottles  ? Culture   Final  ?  NO GROWTH 4 DAYS ?Performed at [facility], [place] [address] [place],  [address] ?  ? Report Status PENDING  Incomplete  ? ?  ? ? ?Radiology Studies: ?No results found. ? ? ? ? LOS: 2 days  ? ?Bonnielee Haff ? ?[facility] ?Pager on [email or web address] ? ?03/15/2022, 9:31 AM ? ? ?

## 2022-03-15 NOTE — Plan of Care (Signed)
  Problem: Health Behavior/Discharge Planning: Goal: Ability to manage health-related needs will improve Outcome: Progressing   Problem: Clinical Measurements: Goal: Ability to maintain clinical measurements within normal limits will improve Outcome: Progressing   

## 2022-03-16 LAB — BASIC METABOLIC PANEL
Anion gap: 6 (ref 5–15)
BUN: 27 mg/dL — ABNORMAL HIGH (ref 8–23)
CO2: 24 mmol/L (ref 22–32)
Calcium: 8.2 mg/dL — ABNORMAL LOW (ref 8.9–10.3)
Chloride: 106 mmol/L (ref 98–111)
Creatinine, Ser: 1.83 mg/dL — ABNORMAL HIGH (ref 0.61–1.24)
GFR, Estimated: 35 mL/min — ABNORMAL LOW (ref >60)
Glucose, Bld: 162 mg/dL — ABNORMAL HIGH (ref 70–99)
Potassium: 4.7 mmol/L (ref 3.5–5.1)
Sodium: 136 mmol/L (ref 135–145)

## 2022-03-16 LAB — CULTURE, BLOOD (ROUTINE X 2): Culture: NO GROWTH

## 2022-03-16 MED ORDER — HALOPERIDOL 0.5 MG PO TABS: 0.5000 mg | ORAL_TABLET | Freq: Three times a day (TID) | ORAL | 0 refills | Status: DC | PRN

## 2022-03-16 MED ORDER — POLYETHYLENE GLYCOL 3350 17 G PO PACK
17.0000 g | PACK | Freq: Every day | ORAL | Status: DC
  Administered 2022-03-16 – 2022-03-18 (×3): 17 g via ORAL
  Filled 2022-03-16 (×3): qty 1

## 2022-03-16 MED ORDER — SENNOSIDES-DOCUSATE SODIUM 8.6-50 MG PO TABS
2.0000 | ORAL_TABLET | Freq: Two times a day (BID) | ORAL | Status: DC
  Administered 2022-03-16 – 2022-03-18 (×5): 2 via ORAL
  Filled 2022-03-16 (×6): qty 2

## 2022-03-16 MED ORDER — MIRTAZAPINE 7.5 MG PO TABS: 7.5000 mg | ORAL_TABLET | Freq: Every day | ORAL | Status: AC

## 2022-03-16 MED ORDER — ENOXAPARIN SODIUM 30 MG/0.3ML IJ SOSY: 30.0000 mg | PREFILLED_SYRINGE | INTRAMUSCULAR | 0 refills | Status: DC

## 2022-03-16 NOTE — Progress Notes (Signed)
? ?[facility] ?PROGRESS NOTE ? ? ?Jesse Cherry [id number] DOB: Feb 01, 1933 DOA: 03/11/2022 ? ?PCP: Bailey Mech, PA-C ? ?Brief History/Interval Summary: 88 y.o. male with medical history significant of hip fracture, dementia, CKD, anemia, anxiety, BPH, neuropathy presented after a fall at his memory care unit. History provided with assistance of daughter and chart review due to patient's baseline dementia.  Apparently has had multiple falls recently.  Recently hospitalized for hip fracture after another fall.  He underwent surgical intervention.  Found to have a urinary tract infection.  Imaging studies of the head raise concern for displacement.  Orthopedics was consulted.   ? ?Consultants: Orthopedics ? ?Procedures: None ? ? ?Subjective/Interval History: ?Patient awake alert.  Confused as before.  Hard of hearing. ? ? ?Assessment/Plan: ? ?Recurrent falls ?Patient has had numerous falls at his assisted living facility recently.  UTI may have contributed.  Seen by PT and OT.  Skilled nursing facility is recommended.  ? ?Acute UTI ?UA was noted to be abnormal.  Started on ceftriaxone.  Multiple species noted on urine culture.  Ceftriaxone was changed over to Keflex on 4/29.  Will treat for total 7 days. ? ?Recent hip fracture ?Underwent right hip hemiarthroplasty on April 20.  Initial imaging studies raised concern for displacement.  CT was done.  Orthopedics was consulted.  They have reviewed the CT report.  No intervention is recommended.  Weight-bear as tolerated.  PT and OT following.  Pain seems to be reasonably well controlled. ?DVT prophylaxis with Lovenox till May 20. ?  ?Anxiety/Dementia ?Noted to be on multiple psychotropic agents including BuSpar, Depakote, Remeron, Zoloft, trazodone.  Polypharmacy could be contributing. ?Requiring sitter while in the hospital.   ?Dose of Remeron has been decreased.  Trazodone had to be increased back to his usual dose.  Slept better last night.   Awake this morning.   ? ?CKD3b ?Required hydration for 24 hours.  Creatinine noted to be stable for the most part.  Monitor urine output.  Continue sodium bicarbonate.    ?  ?Anemia of chronic kidney disease ?Stable hemoglobin noted.  Noted to be on iron supplements which is being continued. ? ? ?DVT Prophylaxis: Lovenox ?Code Status: DNR ?Family Communication: No family at bedside ?Disposition Plan: Waiting on SNF ? ?Status is: Inpatient ?Remains inpatient appropriate because: Acute UTI causing worsening confusion with falls ? ? ? ? ? ?Medications: Scheduled: ? busPIRone  5 mg Oral QHS  ? cephALEXin  250 mg Oral Q8H  ? divalproex  125 mg Oral BID  ? enoxaparin (LOVENOX) injection  30 mg Subcutaneous Q24H  ? ferrous sulfate  325 mg Oral Daily  ? mirtazapine  7.5 mg Oral QHS  ? polyethylene glycol  17 g Oral Daily  ? senna-docusate  2 tablet Oral BID  ? sertraline  25 mg Oral Daily  ? sodium bicarbonate  650 mg Oral BID  ? sodium chloride flush  3 mL Intravenous Q12H  ? traZODone  50 mg Oral QHS  ? ?Continuous: ? ? ?[id number] **OR** acetaminophen, haloperidol ? ?Antibiotics: ?Anti-infectives (From admission, onward)  ? ? Start     Dose/Rate Route Frequency Ordered Stop  ? 03/15/22 1400  cephALEXin (KEFLEX) capsule 250 mg       ? 250 mg Oral Every 8 hours 03/15/22 0935 03/19/22 1359  ? 03/11/22 2000  cefTRIAXone (ROCEPHIN) 1 g in sodium chloride 0.9 % 100 mL IVPB  Status:  Discontinued       ? 1 g ?200 mL/hr  over 30 Minutes Intravenous Every 24 hours 03/11/22 1935 03/15/22 0935  ? 03/11/22 1815  sulfamethoxazole-trimethoprim (BACTRIM DS) 800-160 MG per tablet 1 tablet       ? 1 tablet Oral  Once 03/11/22 1807 03/11/22 1827  ? ?  ? ? ?Objective: ? ?Vital Signs ? ?Vitals:  ? 03/15/22 2006 03/16/22 5170 03/16/22 0715 03/16/22 0919  ?BP: 102/62 105/76  105/63  ?Pulse: 74 64  63  ?Resp: 18 15  18   ?Temp: 98.1 ?F (36.7 ?C) 97.9 ?F (36.6 ?C)  98.1 ?F (36.7 ?C)  ?TempSrc: Oral Axillary  Oral  ?SpO2: 95% 100% 100% 99%   ?Weight:      ?Height:      ? ? ?Intake/Output Summary (Last 24 hours) at 03/16/2022 1010 ?Last data filed at 03/15/2022 2300 ?Gross per 24 hour  ?Intake 170 ml  ?Output 900 ml  ?Net -730 ml  ? ? ?Filed Weights  ? 03/11/22 1323  ?Weight: 74.7 kg  ? ?Awake alert.  In no distress.  Confused and distracted ?Lungs clear to auscultation bilaterally. ?S1-S2 is normal regular. ?Abdomen is soft.  Nontender nondistended. ? ? ?Lab Results: ? ?Data Reviewed: I have personally reviewed following labs and reports of the imaging studies ? ?CBC: ?Recent Labs  ?Lab 03/11/22 ?1600 03/12/22 ?0225 03/13/22 ?0434  ?WBC 12.1* 11.1* 12.7*  ?NEUTROABS 8.6*  --   --   ?HGB 9.7* 8.9* 9.3*  ?HCT 30.0* 27.5* 28.7*  ?MCV 97.7 97.2 96.3  ?PLT 319 328 344  ? ? ? ?Basic Metabolic Panel: ?Recent Labs  ?Lab 03/12/22 ?0225 03/13/22 ?0434 03/14/22 ?0220 03/15/22 ?03/17/22 03/16/22 ?0109  ?NA 140 139 137 136 136  ?K 4.4 4.6 4.4 4.6 4.7  ?CL 110 107 107 108 106  ?CO2 23 23 23 24 24   ?GLUCOSE 77 82 94 90 162*  ?BUN 36* 35* 28* 23 27*  ?CREATININE 1.82* 1.98* 1.76* 1.85* 1.83*  ?CALCIUM 8.1* 8.2* 7.9* 8.0* 8.2*  ? ? ? ?GFR: ?Estimated Creatinine Clearance: 27.1 mL/min (A) (by C-G formula based on SCr of 1.83 mg/dL (H)). ? ?Liver Function Tests: ?Recent Labs  ?Lab 03/11/22 ?1600 03/12/22 ?0225  ?AST 16 12*  ?ALT 7 8  ?ALKPHOS 61 58  ?BILITOT 0.7 0.6  ?PROT 6.3* 5.9*  ?ALBUMIN 2.7* 2.4*  ? ? ? ?Recent Results (from the past 240 hour(s))  ?Urine Culture     Status: Abnormal  ? Collection Time: 03/11/22  3:27 PM  ? Specimen: Urine, Clean Catch  ?Result Value Ref Range Status  ? Specimen Description URINE, CLEAN CATCH  Final  ? Special Requests   Final  ?  NONE ?Performed at [facility], [address], [place], [address] [place] ?  ? Culture MULTIPLE SPECIES PRESENT, SUGGEST RECOLLECTION (A)  Final  ? Report Status 03/12/2022 FINAL  Final  ?Culture, blood (routine x 2)     Status: None  ? Collection Time: 03/11/22  6:54 PM  ? Specimen: BLOOD  ?Result Value  Ref Range Status  ? Specimen Description BLOOD SITE NOT SPECIFIED  Final  ? Special Requests   Final  ?  BOTTLES DRAWN AEROBIC AND ANAEROBIC Blood Culture results may not be optimal due to an inadequate volume of blood received in culture bottles  ? Culture   Final  ?  NO GROWTH 5 DAYS ?Performed at [facility], [address] [place], [address] [place] ?  ? Report Status 03/16/2022 FINAL  Final  ? ?  ? ? ?  Radiology Studies: ?No results found. ? ? ? ? LOS: 3 days  ? ?Osvaldo ShipperGokul Elenora Hawbaker ? ?[facility] ?Pager on [email or web address] ? ?03/16/2022, 10:10 AM ? ? ?

## 2022-03-16 NOTE — Progress Notes (Addendum)
[facility] [id number] Civil engineer, contracting [facility]) Hospital Liaison RN note    ?  ?This is a current [facility] hospice patient from [facility] with a terminal diagnosis of protein calorie malnutrition. He was transported to ED for evaluation following a fall. He  was admitted to the hospital on 03/11/22 for pain management and evaluation of previous femur fracture. Per Dr. Kirt Boys, an [facility] Physician,  this is a related hospital admission.  ?  ?This patient is appropriate for hospice care for symptom management, PT/OT evaluation and safe discharge plan.  ? ?Patient awake and alert but confused. He does not know which daughter is which today. Daughter states he slept better last night and sleep earlier today. He does not appear in distress.  ? ?VS: 98.1, 105/63, 63, 18, 99% RA ?I/O: 290/1000 ? ?Abnormal labs: ?03/16/22 01:09 ?Glucose: 162 (H) ?BUN: 27 (H) ?Creatinine: 1.83 (H) ?Calcium: 8.2 (L) ?GFR, Estimated: 35 (L) ? ?No new diagnostics.  ? ?IV/PRN medications: tylenol 650mg  PO PRN @ 0800 ? ?Problem list: ?Assessment/Plan: ?  ?Recurrent falls ?Patient has had numerous falls at his assisted living facility recently.  UTI may have contributed.  Seen by PT and OT.  Skilled nursing facility is recommended.  ?  ?Acute UTI ?UA was noted to be abnormal.  Started on ceftriaxone.  Multiple species noted on urine culture.  Ceftriaxone was changed over to Keflex on 4/29.  Will treat for total 7 days. ?  ?Recent hip fracture ?Underwent right hip hemiarthroplasty on April 20.  Initial imaging studies raised concern for displacement.  CT was done.  Orthopedics was consulted.  They have reviewed the CT report.  No intervention is recommended.  Weight-bear as tolerated.  PT and OT following.  Pain seems to be reasonably well controlled. ?DVT prophylaxis with Lovenox till May 20. ?  ?Anxiety/Dementia ?Noted to be on multiple psychotropic agents including BuSpar, Depakote, Remeron, Zoloft, trazodone.  Polypharmacy could be  contributing. ?Requiring sitter while in the hospital.   ?Dose of Remeron has been decreased.  Trazodone had to be increased back to his usual dose.  Slept better last night.  Awake this morning.   ? ?CKD3b ?Required hydration for 24 hours.  Creatinine noted to be stable for the most part.  Monitor urine output.  Continue sodium bicarbonate.    ?  ?Anemia of chronic kidney disease ?Stable hemoglobin noted.  Noted to be on iron supplements which is being continued. ?  ?DVT Prophylaxis: Lovenox ?Code Status: DNR ?Family Communication: No family at bedside ?Disposition Plan: Waiting on SNF ?Status is: Inpatient ?Remains inpatient appropriate because: Acute UTI causing worsening confusion with falls ? ?Discharge planning: ongoing, evaluation for SNF for rehab. May 22 SNF are out of network for his insurance. Continue to seek placement.  ?Family contact: spoke with daughter in room ?IDG: Updated  ?GOC: Clear - DNR ?  ?Should patient need ambulance transport at discharge please use [facility] as [facility] contracts this service with them for our active hospice patients. ?  ?Please do not hesitate to call with hospice related questions/concerns, ?  ?Thank you,    ?  ?[facility], RN, CCM       ?[facility] Hospital Liaison    ?[address] [facility]  ?

## 2022-03-16 NOTE — Plan of Care (Signed)
  Problem: Clinical Measurements: Goal: Ability to maintain clinical measurements within normal limits will improve Outcome: Progressing   

## 2022-03-16 NOTE — TOC Progression Note (Addendum)
Transition of Care (TOC) - Progression Note  ? ? ?Patient Details  ?Name: Jesse Cherry ?MRN: [id number] ?Date of Birth: 10-16-1933 ? ?Transition of Care (TOC) CM/SW Contact  ?Lorri Frederick, LCSW ?Phone Number: ?03/16/2022, 9:11 AM ? ?Clinical Narrative:   CSW received email from [place] at [facility], they are out of network with [employer]. ? ?1320: CSW LM with Maurine Minister at [place] [facility] requesting that he review referral.   ? ?Expected Discharge Plan: Skilled Nursing Facility ?Barriers to Discharge: Continued Medical Work up, SNF Pending bed offer ? ?Expected Discharge Plan and Services ?Expected Discharge Plan: Skilled Nursing Facility ?In-house Referral: Clinical Social Work ?  ?Post Acute Care Choice: Skilled Nursing Facility ?Living arrangements for the past 2 months: Assisted Living Facility ?                ?  ?  ?  ?  ?  ?  ?  ?  ?  ?  ? ? ?Social Determinants of Health (SDOH) Interventions ?  ? ?Readmission Risk Interventions ?   ? View : No data to display.  ?  ?  ?  ? ? ?

## 2022-03-17 MED ORDER — CEPHALEXIN 250 MG PO CAPS: 250.0000 mg | ORAL_CAPSULE | Freq: Three times a day (TID) | ORAL | 0 refills | Status: AC

## 2022-03-17 NOTE — Plan of Care (Signed)
  Problem: Education: Goal: Knowledge of General Education information will improve Description Including pain rating scale, medication(s)/side effects and non-pharmacologic comfort measures Outcome: Progressing   

## 2022-03-17 NOTE — Discharge Summary (Signed)
[facility]  Physician Discharge Summary   Patient ID: Jesse Cherry MRN: [id number] DOB/AGE: October 10, 1933 88 y.o.  Admit date: 03/11/2022 Discharge date:   03/17/2022   PCP: Bailey Mech, PA-C  DISCHARGE DIAGNOSES:  Recurrent falls Acute UTI Recent right hip fracture History of dementia, moderate to advanced Chronic kidney disease stage IIIb Anemia of chronic kidney disease  RECOMMENDATIONS FOR OUTPATIENT FOLLOW UP: Please check CBC and basic metabolic panel in 1 week Lovenox to be given until May 20    Home Health: SNF Equipment/Devices: None  CODE STATUS: DNR  DISCHARGE CONDITION: fair  Diet recommendation: Regular as tolerated  INITIAL HISTORY: 88 y.o. male with medical history significant of hip fracture, dementia, CKD, anemia, anxiety, BPH, neuropathy presented after a fall at his memory care unit. History provided with assistance of daughter and chart review due to patient's baseline dementia.  Apparently has had multiple falls recently.  Recently hospitalized for hip fracture after another fall.  He underwent surgical intervention.  Found to have a urinary tract infection.  Imaging studies of the head raise concern for displacement.  Orthopedics was consulted.     Consultants: Orthopedics   Procedures: None    HOSPITAL COURSE:   Recurrent falls Patient has had numerous falls at his assisted living facility recently.  UTI may have contributed.  Seen by PT and OT.  Skilled nursing facility is recommended.    Acute UTI UA was noted to be abnormal.  Started on ceftriaxone.  Multiple species noted on urine culture.  Ceftriaxone was changed over to Keflex on 4/29.  Will treat for total 7 days.   Recent hip fracture Underwent right hip hemiarthroplasty on April 20.  Initial imaging studies raised concern for displacement.  CT was done.  Orthopedics was consulted.  They have reviewed the CT report.  No intervention is recommended.  Weight-bear  as tolerated.  PT and OT following.  Pain seems to be reasonably well controlled. DVT prophylaxis with Lovenox till May 20.   Anxiety/Dementia Noted to be on multiple psychotropic agents including BuSpar, Depakote, Remeron, Zoloft, trazodone.  Polypharmacy could be contributing. Requiring sitter while in the hospital.   Dose of Remeron has been decreased.  Trazodone had to be increased back to his usual dose.   May need further dose adjustments.  [id number] Required hydration for 24 hours.  Creatinine noted to be stable for the most part. Continue sodium bicarbonate.   Recheck labs in a week.   Anemia of chronic kidney disease Stable hemoglobin noted.  Noted to be on iron supplements which is being continued.  Patient is stable.  Okay for discharge to SNF when bed is available for   PERTINENT LABS:  The results of significant diagnostics from this hospitalization (including imaging, microbiology, ancillary and laboratory) are listed below for reference.    Microbiology: Recent Results (from the past 240 hour(s))  Urine Culture     Status: Abnormal   Collection Time: 03/11/22  3:27 PM   Specimen: Urine, Clean Catch  Result Value Ref Range Status   Specimen Description URINE, CLEAN CATCH  Final   Special Requests   Final    NONE Performed at [facility], [address] [place], [place] [address]    Culture MULTIPLE SPECIES PRESENT, SUGGEST RECOLLECTION (A)  Final   Report Status 03/12/2022 FINAL  Final  Culture, blood (routine x 2)     Status: None   Collection Time: 03/11/22  6:54 PM   Specimen: BLOOD  Result Value Ref Range Status   Specimen Description BLOOD SITE NOT SPECIFIED  Final   Special Requests   Final    BOTTLES DRAWN AEROBIC AND ANAEROBIC Blood Culture results may not be optimal due to an inadequate volume of blood received in culture bottles   Culture   Final    NO GROWTH 5 DAYS Performed at [facility], [address] [place], [place] [address]     Report Status 03/16/2022 FINAL  Final     Labs:  COVID-19 Labs   Lab Results  Component Value Date   SARSCOV2NAA POSITIVE (A) 12/23/2021      Basic Metabolic Panel: Recent Labs  Lab 03/12/22 0225 03/13/22 0434 03/14/22 0220 03/15/22 0137 03/16/22 0109  NA 140 139 137 136 136  K 4.4 4.6 4.4 4.6 4.7  CL 110 107 107 108 106  CO2 23 23 23 24 24   GLUCOSE 77 82 94 90 162*  BUN 36* 35* 28* 23 27*  CREATININE 1.82* 1.98* 1.76* 1.85* 1.83*  CALCIUM 8.1* 8.2* 7.9* 8.0* 8.2*   Liver Function Tests: Recent Labs  Lab 03/11/22 1600 03/12/22 0225  AST 16 12*  ALT 7 8  ALKPHOS 61 58  BILITOT 0.7 0.6  PROT 6.3* 5.9*  ALBUMIN 2.7* 2.4*    CBC: Recent Labs  Lab 03/11/22 1600 03/12/22 0225 03/13/22 0434  WBC 12.1* 11.1* 12.7*  NEUTROABS 8.6*  --   --   HGB 9.7* 8.9* 9.3*  HCT 30.0* 27.5* 28.7*  MCV 97.7 97.2 96.3  PLT 319 328 344     IMAGING STUDIES  CT Head Wo Contrast  Result Date: 03/11/2022 CLINICAL DATA:  Head trauma, fall EXAM: CT HEAD WITHOUT CONTRAST TECHNIQUE: Contiguous axial images were obtained from the base of the skull through the vertex without intravenous contrast. RADIATION DOSE REDUCTION: This exam was performed according to the departmental dose-optimization program which includes automated exposure control, adjustment of the mA and/or kV according to patient size and/or use of iterative reconstruction technique. COMPARISON:  CT head 03/05/2022 FINDINGS: Brain: No acute intracranial hemorrhage, mass effect, or herniation. No extra-axial fluid collections. No evidence of acute territorial infarct. No hydrocephalus. Moderate cortical volume loss. Patchy hypodensities in the periventricular and subcortical white matter, likely secondary to chronic microvascular ischemic changes. Vascular: No hyperdense vessel or unexpected calcification. Skull: Normal. Negative for fracture or focal lesion. Sinuses/Orbits: No acute process identified. Mild mucosal  thickening in the right sphenoid sinus. Other: None. IMPRESSION: Chronic changes with no acute intracranial process identified. Electronically Signed   By: Jannifer Hick M.D.   On: 03/11/2022 16:03    CT Cervical Spine Wo Contrast  Result Date: 03/11/2022 CLINICAL DATA:  Neck trauma EXAM: CT CERVICAL SPINE WITHOUT CONTRAST TECHNIQUE: Multidetector CT imaging of the cervical spine was performed without intravenous contrast. Multiplanar CT image reconstructions were also generated. RADIATION DOSE REDUCTION: This exam was performed according to the departmental dose-optimization program which includes automated exposure control, adjustment of the mA and/or kV according to patient size and/or use of iterative reconstruction technique. COMPARISON:  None. FINDINGS: Study is limited due to motion. Alignment: Grade 1 anterolisthesis of C7 on T1. Skull base and vertebrae: No acute fracture. No primary bone lesion or focal pathologic process. Soft tissues and spinal canal: No prevertebral fluid or swelling. No visible canal hematoma. Disc levels: Severe intervertebral disc height loss from C3 through C7. Associated endplate sclerosis and small endplate osteophytes. Bilateral facet arthropathy and neural foraminal narrowing. Upper chest: No  acute process visualized. Other: None. IMPRESSION: 1. No acute fracture or malalignment identified. Limited study due to motion. 2. Advanced degenerative changes. Electronically Signed   By: Jannifer Hick M.D.   On: 03/11/2022 16:06   CT HIP RIGHT WO CONTRAST  Result Date: 03/11/2022 CLINICAL DATA:  Hip replacement, suspected periprostatic fracture. EXAM: CT OF THE RIGHT HIP WITHOUT CONTRAST TECHNIQUE: Multidetector CT imaging of the right hip was performed according to the standard protocol. Multiplanar CT image reconstructions were also generated. RADIATION DOSE REDUCTION: This exam was performed according to the departmental dose-optimization program which includes  automated exposure control, adjustment of the mA and/or kV according to patient size and/or use of iterative reconstruction technique. COMPARISON:  03/11/2022, 03/10/2022, 03/06/2022. FINDINGS: Bones/Joint/Cartilage Total hip arthroplasty changes are noted on the right with multiple cerclage wires in place at the proximal right hip. There is a slightly comminuted fracture of the greater trochanter with stable alignment of the fracture fragments as compared with previous exams. Ligaments Suboptimally assessed by CT. Muscles and Tendons Mild intramuscular edema is noted at the right hip which is likely related to recent surgical changes. Soft tissues Soft tissue fat stranding and foci of air are noted at the right hip and thigh which is likely related to recent postsurgical changes. Vascular calcifications are present in the soft tissues. The prostate gland is enlarged. IMPRESSION: Right hip arthroplasty changes with cerclage wires in place. A slightly comminuted fracture of the greater trochanter is noted with stable alignment as compared with prior exams. Electronically Signed   By: Thornell Sartorius M.D.   On: 03/11/2022 23:32   DG Hip Unilat W or Wo Pelvis 2-3 Views Right  Result Date: 03/11/2022 CLINICAL DATA:  Fall. Recent right hip arthroplasty. Multiple falls since yesterday. EXAM: DG HIP (WITH OR WITHOUT PELVIS) 2-3V RIGHT COMPARISON:  Pelvis and right hip radiographs yesterday. FINDINGS: Right hip arthroplasty in place. Cerclage wire fixation including cerclage fixation of greater trochanter. There may be slight increased displacement of the greater trochanteric fracture fragment since yesterday (appreciated on the full field of view pelvis). No other acute fracture. No evidence of pubic rami fracture. Pubic symphysis and sacroiliac joints are congruent left proximal femur hardware is intact were visualized. Persistent postsurgical soft tissue changes. IMPRESSION: Right hip arthroplasty with cerclage wire  fixation of greater trochanter. Slight increased displacement of the greater trochanteric fracture fragment since yesterday. Electronically Signed   By: Narda Rutherford M.D.   On: 03/11/2022 17:40   DG Hip Unilat W or Wo Pelvis 2-3 Views Right  Result Date: 03/10/2022 CLINICAL DATA:  Fall out of bed today.  Right hip surgery last week. EXAM: DG HIP (WITH OR WITHOUT PELVIS) 2-3V RIGHT COMPARISON:  Right hip radiograph 03/06/2022 FINDINGS: Right hip arthroplasty in unchanged alignment. Cerclage wires including a cerclage wire about the greater trochanteric, unchanged from prior exam. There is no periprosthetic fracture. No periprosthetic lucency. Intact pubic rami. Persistent changes in the soft tissues from recent surgery with soft tissue air and edema, diminished. Pubic symphysis and sacroiliac joints are congruent. Surgical hardware in the left proximal femur is intact were visualized. IMPRESSION: Right hip arthroplasty without complication or acute fracture. Electronically Signed   By: Narda Rutherford M.D.   On: 03/10/2022 19:59    DISCHARGE EXAMINATION: Vitals:   03/16/22 0715 03/16/22 0919 03/16/22 1923 03/17/22 0357  BP:  105/63 103/61 105/61  Pulse:  63 68   Resp:  18 18 18   Temp:  98.1 F (36.7 C)  (!)  97.5 F (36.4 C)  TempSrc:  Oral  Axillary  SpO2: 100% 99% 99% 95%  Weight:      Height:       General appearance: Awake alert.  In no distress.  Distracted Resp: Clear to auscultation bilaterally.  Normal effort Cardio: S1-S2 is normal regular.  No S3-S4.  No rubs murmurs or bruit GI: Abdomen is soft.  Nontender nondistended.  Bowel sounds are present normal.  No masses organomegaly   DISPOSITION: SNF  Discharge Instructions     Call MD for:  difficulty breathing, headache or visual disturbances   Complete by: As directed    Call MD for:  extreme fatigue   Complete by: As directed    Call MD for:  persistant dizziness or light-headedness   Complete by: As directed    Call  MD for:  persistant nausea and vomiting   Complete by: As directed    Call MD for:  severe uncontrolled pain   Complete by: As directed    Call MD for:  temperature >100.4   Complete by: As directed    Diet - low sodium heart healthy   Complete by: As directed    Discharge instructions   Complete by: As directed    Please review instructions on the discharge summary  You were cared for by a hospitalist during your hospital stay. If you have any questions about your discharge medications or the care you received while you were in the hospital after you are discharged, you can call the unit and asked to speak with the hospitalist on call if the hospitalist that took care of you is not available. Once you are discharged, your primary care physician will handle any further medical issues. Please note that NO REFILLS for any discharge medications will be authorized once you are discharged, as it is imperative that you return to your primary care physician (or establish a relationship with a primary care physician if you do not have one) for your aftercare needs so that they can reassess your need for medications and monitor your lab values. If you do not have a primary care physician, you can call [phone] for a physician referral.   Increase activity slowly   Complete by: As directed    No wound care   Complete by: As directed           Allergies as of 03/17/2022       Reactions   Demerol [meperidine Hcl]         Medication List     STOP taking these medications    HYDROcodone-acetaminophen 5-325 MG tablet Commonly known as: NORCO/VICODIN       TAKE these medications    busPIRone 5 MG tablet Commonly known as: BUSPAR Take 5 mg by mouth at bedtime.   cephALEXin 250 MG capsule Commonly known as: KEFLEX Take 1 capsule (250 mg total) by mouth every 8 (eight) hours for 2 days.   divalproex 125 MG DR tablet Commonly known as: DEPAKOTE Take 125 mg by mouth 2 (two) times  daily.   enoxaparin 30 MG/0.3ML injection Commonly known as: LOVENOX Inject 0.3 mLs (30 mg total) into the skin daily for 20 days.   ferrous sulfate 325 (65 FE) MG tablet Take 1 tablet (325 mg total) by mouth daily.   haloperidol 0.5 MG tablet Commonly known as: HALDOL Take 1 tablet (0.5 mg total) by mouth every 8 (eight) hours as needed for agitation.   METAMUCIL PO Take 0.4  g by mouth in the morning and at bedtime.   mirtazapine 7.5 MG tablet Commonly known as: REMERON Take 1 tablet (7.5 mg total) by mouth at bedtime. What changed:  medication strength how much to take   polyethylene glycol 17 g packet Commonly known as: MIRALAX / GLYCOLAX Take 17 g by mouth daily.   sennosides-docusate sodium 8.6-50 MG tablet Commonly known as: SENOKOT-S Take 1 tablet by mouth See admin instructions. At bedtime on Monday,Wednesday and friday   sertraline 25 MG tablet Commonly known as: ZOLOFT Take 25 mg by mouth daily.   sodium bicarbonate 650 MG tablet Take 1 tablet (650 mg total) by mouth 2 (two) times daily.   traZODone 50 MG tablet Commonly known as: DESYREL Take 50 mg by mouth at bedtime.   Vitamin D3 25 MCG tablet Commonly known as: Vitamin D Take 1,000 Units by mouth daily.          Follow-up Information     Joen Laura, MD. Schedule an appointment as soon as possible for a visit in 2 week(s).   Specialty: Orthopedic Surgery Contact information: [address] [place] [address] [phone]                 TOTAL DISCHARGE TIME: 35 minutes  Deneane Stifter Rito Ehrlich  [facility] Pager on [email or web address]  03/17/2022, 9:29 AM

## 2022-03-17 NOTE — Progress Notes (Signed)
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 ?Mr. Winterrowd was discharged from hospice services to pursue SNF for rehab. ? ?Family requests to continue with outpatient palliative care services, and our outpatient palliative care will continue to follow and serve him at the SNF he discharges to. ? ?Thank you, ?Wallis Bamberg BSN, RN ?[facility] Liaison ?

## 2022-03-17 NOTE — TOC Progression Note (Addendum)
Transition of Care (TOC) - Progression Note  ? ? ?Patient Details  ?Name: Jesse Cherry ?MRN: [id number] ?Date of Birth: 04-01-1933 ? ?Transition of Care (TOC) CM/SW Contact  ?Lorri Frederick, LCSW ?Phone Number: ?03/17/2022, 10:36 AM ? ?Clinical Narrative:    ?CSW LM with Maurine Minister at [employer]. ? ?CSW spoke with Adella Nissen at [facility] are listed as "considering" bed offer on the hub.  She will get an answer as to whether they can offer a bed. ? ?CSW LM with pt daughter Arline Asp to discuss selecting SNF facility.  ? ?1050: Kristal reports neither [place] or [facility] can offer a bed.  ? ?Per RN, sitter was DC at 930am today. ? ?1130: CSW spoke with pt daughter Crystal in room, updated her.  Only current offer is [facility].  She will speak with her sister about this option.  ? ?1245: [facility] cannot offer a bed.  ? ?1400: CSW received call from admissions person at [facility], Maurine Minister is out, they will review. Daughter Crystal informed, [facility] is first choice but will accept [facility] offer if [place] [facility] says no.   ? ?CSW spoke with Janie/[facility] and she can accept pt if family chooses.  Per Wille Celeste, since pt revoked hospice, he will be regular medicare for month of May, not [employer].  No auth needed.  ? ?1545: Crystal asks CSW to speak with Lequita Halt at [facility] [place], [phone], fax: [phone].  CSW did so, referral faxed. ? ?Expected Discharge Plan: Skilled Nursing Facility ?Barriers to Discharge: Continued Medical Work up, SNF Pending bed offer ? ?Expected Discharge Plan and Services ?Expected Discharge Plan: Skilled Nursing Facility ?In-house Referral: Clinical Social Work ?  ?Post Acute Care Choice: Skilled Nursing Facility ?Living arrangements for the past 2 months: Assisted Living Facility ?Expected Discharge Date: 03/17/22               ?  ?  ?  ?  ?  ?  ?  ?  ?  ?  ? ? ?Social Determinants of Health (SDOH) Interventions ?   ? ?Readmission Risk Interventions ?   ? View : No data to display.  ?  ?  ?  ? ? ?

## 2022-03-18 NOTE — Discharge Summary (Signed)
?[facility] ? ?Physician Discharge Summary  ? ?Patient ID: ?Jesse Cherry ?MRN: [id number] ?DOB/AGE: 24-Apr-1933 88 y.o. ? ?Admit date: 03/11/2022 ?Discharge date:   03/18/2022 ? ? ?PCP: Jonathon Bellows, PA-C ? ?DISCHARGE DIAGNOSES:  ?Recurrent falls ?Acute UTI ?Recent right hip fracture ?History of dementia, moderate to advanced ?Chronic kidney disease stage IIIb ?Anemia of chronic kidney disease ? ?RECOMMENDATIONS FOR OUTPATIENT FOLLOW UP: ?Please check CBC and basic metabolic panel in 1 week ?Lovenox to be given until May 20 ? ? ? ?Home Health: SNF ?Equipment/Devices: None ? ?CODE STATUS: DNR ? ?DISCHARGE CONDITION: fair ? ?Diet recommendation: Regular as tolerated ? ?INITIAL HISTORY: ?88 y.o. male with medical history significant of hip fracture, dementia, CKD, anemia, anxiety, BPH, neuropathy presented after a fall at his memory care unit. History provided with assistance of daughter and chart review due to patient's baseline dementia.  Apparently has had multiple falls recently.  Recently hospitalized for hip fracture after another fall.  He underwent surgical intervention.  Found to have a urinary tract infection.  Imaging studies of the head raise concern for displacement.  Orthopedics was consulted.   ?  ?Consultants: Orthopedics ?  ?Procedures: None ? ? ? ?HOSPITAL COURSE:  ? ?Recurrent falls ?Patient has had numerous falls at his assisted living facility recently.  UTI may have contributed.  Seen by PT and OT.  Skilled nursing facility is recommended.  ?  ?Acute UTI ?UA was noted to be abnormal.  Started on ceftriaxone.  Multiple species noted on urine culture.  Ceftriaxone was changed over to Keflex on 4/29.  Will treat for total 7 days. ?  ?Recent hip fracture ?Underwent right hip hemiarthroplasty on April 20.  Initial imaging studies raised concern for displacement.  CT was done.  Orthopedics was consulted.  They have reviewed the CT report.  No intervention is recommended.  Weight-bear  as tolerated.  PT and OT following.  Pain seems to be reasonably well controlled. ?DVT prophylaxis with Lovenox till May 20. ?  ?Anxiety/Dementia ?Noted to be on multiple psychotropic agents including BuSpar, Depakote, Remeron, Zoloft, trazodone.  Polypharmacy could be contributing. ?Requiring sitter while in the hospital.   ?Dose of Remeron has been decreased.  Trazodone had to be increased back to his usual dose.   ?May need further dose adjustments. ? ?CKD3b ?Required hydration for 24 hours.  Creatinine noted to be stable for the most part. Continue sodium bicarbonate.   Recheck labs in a week. ?  ?Anemia of chronic kidney disease ?Stable hemoglobin noted.  Noted to be on iron supplements which is being continued. ? ?Patient remains stable.  Okay for discharge to SNF when bed is available. ? ? ?PERTINENT LABS: ? ?The results of significant diagnostics from this hospitalization (including imaging, microbiology, ancillary and laboratory) are listed below for reference.   ? ?Microbiology: ?Recent Results (from the past 240 hour(s))  ?Urine Culture     Status: Abnormal  ? Collection Time: 03/11/22  3:27 PM  ? Specimen: Urine, Clean Catch  ?Result Value Ref Range Status  ? Specimen Description URINE, CLEAN CATCH  Final  ? Special Requests   Final  ?  NONE ?Performed at [facility], [place] [address] [place], [place] [address] ?  ? Culture MULTIPLE SPECIES PRESENT, SUGGEST RECOLLECTION (A)  Final  ? Report Status 03/12/2022 FINAL  Final  ?Culture, blood (routine x 2)     Status: None  ? Collection Time: 03/11/22  6:54 PM  ? Specimen: BLOOD  ?Result  Value Ref Range Status  ? Specimen Description BLOOD SITE NOT SPECIFIED  Final  ? Special Requests   Final  ?  BOTTLES DRAWN AEROBIC AND ANAEROBIC Blood Culture results may not be optimal due to an inadequate volume of blood received in culture bottles  ? Culture   Final  ?  NO GROWTH 5 DAYS ?Performed at [facility], [place] [address], [place], [place] [address] ?   ? Report Status 03/16/2022 FINAL  Final  ?  ? ?Labs: ? ?COVID-19 Labs ? ? ?Lab Results  ?Component Value Date  ? SARSCOV2NAA POSITIVE (A) 12/23/2021  ? ? ? ? ?Basic Metabolic Panel: ?Recent Labs  ?Lab 03/12/22 ?0225 03/13/22 ?[id number] 03/14/22 ?0220 03/15/22 ?[id number] 03/16/22 ?0109  ?NA 140 139 137 136 136  ?K 4.4 4.6 4.4 4.6 4.7  ?CL 110 107 107 108 106  ?CO2 23 23 23 24 24   ?GLUCOSE 77 82 94 90 162*  ?BUN 36* 35* 28* 23 27*  ?CREATININE 1.82* 1.98* 1.76* 1.85* 1.83*  ?CALCIUM 8.1* 8.2* 7.9* 8.0* 8.2*  ? ?Liver Function Tests: ?Recent Labs  ?Lab 03/11/22 ?1600 03/12/22 ?0225  ?AST 16 12*  ?ALT 7 8  ?ALKPHOS 61 58  ?BILITOT 0.7 0.6  ?PROT 6.3* 5.9*  ?ALBUMIN 2.7* 2.4*  ? ? ?CBC: ?Recent Labs  ?Lab 03/11/22 ?1600 03/12/22 ?0225 03/13/22 ?0434  ?WBC 12.1* 11.1* 12.7*  ?NEUTROABS 8.6*  --   --   ?HGB 9.7* 8.9* 9.3*  ?HCT 30.0* 27.5* 28.7*  ?MCV 97.7 97.2 96.3  ?PLT 319 328 344  ? ? ? ?IMAGING STUDIES ? ?CT Head Wo Contrast ? ?Result Date: 03/11/2022 ?CLINICAL DATA:  Head trauma, fall EXAM: CT HEAD WITHOUT CONTRAST TECHNIQUE: Contiguous axial images were obtained from the base of the skull through the vertex without intravenous contrast. RADIATION DOSE REDUCTION: This exam was performed according to the departmental dose-optimization program which includes automated exposure control, adjustment of the mA and/or kV according to patient size and/or use of iterative reconstruction technique. COMPARISON:  CT head 03/05/2022 FINDINGS: Brain: No acute intracranial hemorrhage, mass effect, or herniation. No extra-axial fluid collections. No evidence of acute territorial infarct. No hydrocephalus. Moderate cortical volume loss. Patchy hypodensities in the periventricular and subcortical white matter, likely secondary to chronic microvascular ischemic changes. Vascular: No hyperdense vessel or unexpected calcification. Skull: Normal. Negative for fracture or focal lesion. Sinuses/Orbits: No acute process identified. Mild mucosal  thickening in the right sphenoid sinus. Other: None. IMPRESSION: Chronic changes with no acute intracranial process identified. Electronically Signed   By: Ofilia Neas M.D.   On: 03/11/2022 16:03  ? ? ?CT Cervical Spine Wo Contrast ? ?Result Date: 03/11/2022 ?CLINICAL DATA:  Neck trauma EXAM: CT CERVICAL SPINE WITHOUT CONTRAST TECHNIQUE: Multidetector CT imaging of the cervical spine was performed without intravenous contrast. Multiplanar CT image reconstructions were also generated. RADIATION DOSE REDUCTION: This exam was performed according to the departmental dose-optimization program which includes automated exposure control, adjustment of the mA and/or kV according to patient size and/or use of iterative reconstruction technique. COMPARISON:  None. FINDINGS: Study is limited due to motion. Alignment: Grade 1 anterolisthesis of C7 on T1. Skull base and vertebrae: No acute fracture. No primary bone lesion or focal pathologic process. Soft tissues and spinal canal: No prevertebral fluid or swelling. No visible canal hematoma. Disc levels: Severe intervertebral disc height loss from C3 through C7. Associated endplate sclerosis and small endplate osteophytes. Bilateral facet arthropathy and neural foraminal narrowing. Upper chest: No acute  process visualized. Other: None. IMPRESSION: 1. No acute fracture or malalignment identified. Limited study due to motion. 2. Advanced degenerative changes. Electronically Signed   By: Ofilia Neas M.D.   On: 03/11/2022 16:06  ? ?CT HIP RIGHT WO CONTRAST ? ?Result Date: 03/11/2022 ?CLINICAL DATA:  Hip replacement, suspected periprostatic fracture. EXAM: CT OF THE RIGHT HIP WITHOUT CONTRAST TECHNIQUE: Multidetector CT imaging of the right hip was performed according to the standard protocol. Multiplanar CT image reconstructions were also generated. RADIATION DOSE REDUCTION: This exam was performed according to the departmental dose-optimization program which includes  automated exposure control, adjustment of the mA and/or kV according to patient size and/or use of iterative reconstruction technique. COMPARISON:  03/11/2022, 03/10/2022, 03/06/2022. FINDINGS: Bones/Joint/Carti

## 2022-03-18 NOTE — TOC Progression Note (Signed)
Transition of Care (TOC) - Progression Note  ? ? ?Patient Details  ?Name: Jesse Cherry ?MRN: [id number] ?Date of Birth: 05/09/1933 ? ?Transition of Care (TOC) CM/SW Contact  ?Lorri Frederick, LCSW ?Phone Number: ?03/18/2022, 8:16 AM ? ?Clinical Narrative:   Email received from [facility].  They are unable to make bed offer. ? ? ? ?Expected Discharge Plan: Skilled Nursing Facility ?Barriers to Discharge: Continued Medical Work up, SNF Pending bed offer ? ?Expected Discharge Plan and Services ?Expected Discharge Plan: Skilled Nursing Facility ?In-house Referral: Clinical Social Work ?  ?Post Acute Care Choice: Skilled Nursing Facility ?Living arrangements for the past 2 months: Assisted Living Facility ?Expected Discharge Date: 03/17/22               ?  ?  ?  ?  ?  ?  ?  ?  ?  ?  ? ? ?Social Determinants of Health (SDOH) Interventions ?  ? ?Readmission Risk Interventions ?   ? View : No data to display.  ?  ?  ?  ? ? ?

## 2022-03-18 NOTE — TOC Transition Note (Signed)
Transition of Care (TOC) - CM/SW Discharge Note ? ? ?Patient Details  ?Name: Jesse Cherry ?MRN: [id number] ?Date of Birth: 1933/03/24 ? ?Transition of Care [facility]) CM/SW Contact:  ?Joanne Chars, LCSW ?Phone Number: ?03/18/2022, 9:56 AM ? ? ?Clinical Narrative:   Pt discharging to [facility].  RN call [phone] for report.   ? ? ? ?Final next level of care: [facility] ?Barriers to Discharge: Barriers Resolved ? ? ?Patient Goals and CMS Choice ?  ?CMS Medicare.gov Compare Post Acute Care list provided to:: Patient Represenative (must comment) ?Choice offered to / list presented to : Adult Children ? ?Discharge Placement ?  ?           ?Patient chooses bed at: [facility] ?Patient to be transferred to facility by: [facility] ?Name of family member notified: daughter [name] in room ?Patient and family notified of of transfer: 03/18/22 ? ?Discharge Plan and Services ?In-house Referral: Clinical Social Work ?  ?Post Acute Care Choice: [facility]          ?  ?  ?  ?  ?  ?  ?  ?  ?  ?  ? ?Social Determinants of Health (SDOH) Interventions ?  ? ? ?Readmission Risk Interventions ?   ? View : No data to display.  ?  ?  ?  ? ? ? ? ? ?

## 2022-04-01 ENCOUNTER — Encounter: Payer: Medicare Other | Admitting: Family Medicine

## 2022-04-02 ENCOUNTER — Non-Acute Institutional Stay: Admitting: Family Medicine

## 2022-04-02 VITALS — BP 122/80 | HR 80 | Resp 20 | Wt 164.7 lb

## 2022-04-02 DIAGNOSIS — Z515 Encounter for palliative care: Secondary | ICD-10-CM

## 2022-04-02 DIAGNOSIS — R296 Repeated falls: Secondary | ICD-10-CM

## 2022-04-02 DIAGNOSIS — F419 Anxiety disorder, unspecified: Secondary | ICD-10-CM

## 2022-04-02 DIAGNOSIS — S72001S Fracture of unspecified part of neck of right femur, sequela: Secondary | ICD-10-CM

## 2022-04-03 NOTE — Progress Notes (Signed)
[facility] Consult Note Telephone: [phone]  Fax: [phone]   Date of encounter: 04/02/2022 6:10 PM PATIENT NAME: Jesse Cherry [address] [place] [address]   [phone] (home)  DOB: 08/12/1933 MRN: [id number] PRIMARY CARE PROVIDER:    Jonathon Bellows, PA-C,  [address] [place] [address] [phone]  REFERRING PROVIDER:   Jonathon Bellows, Hershal Coria [address] [place],  [place] [address] [phone]  RESPONSIBLE PARTY:    Contact Information     Name Relation Home Work [place] Daughter [phone]     Venita Sheffield Daughter   [phone]        I met face to face with patient in SNF. Palliative Care was asked to follow this patient by consultation request of  Podraza, Karmen Bongo* to address advance care planning and complex medical decision making. This is the initial visit.          ASSESSMENT, SYMPTOM MANAGEMENT AND PLAN / RECOMMENDATIONS:   Palliative Care Encounter Need to reach out to family to discuss goals of care Review documentation regarding goals of care at facility. Approach family about recent decline, may be approaching eligible candidate for hospice  2.  Falls Frequently/ Recent right hip fracture and repair Agree with trial of PT to see if pt can retain any ability to rehab and perform HEP  3.  Anxiety Has multiple meds for anxiety, depression and mood stabilization including Depakote, Haldol prn, Buspar, Remeron and Zoloft.  Follow up Palliative Care Visit: Palliative care will continue to follow for complex medical decision making, advance care planning, and clarification of goals. Return 2 weeks or prn.    This visit was coded based on medical decision making (MDM).  PPS: 40%  HOSPICE ELIGIBILITY/DIAGNOSIS: TBD  Chief Complaint:  [facility] received a referral to follow up with patient for  chronic disease management of dementia in setting of recent hip fracture.  Palliative Care is also following for advance directive counseling and defining/refining goals of care.  HISTORY OF PRESENT ILLNESS:  Jesse Cherry is a 88 y.o. year old male with dementia in setting of recent right hip fracture. He also has idiopathic peripheral neuropathy, CKD, anemia due to chronic renal failure, BPH, recent complicated UTI, anxiety and frequent falls.  Patient is seen sitting in the hall in the skilled nursing facility eating dinner.  Majority of his speech is nonsensical but he does ask repetitively to go back to his room.  He is also mumbling and appears moderately anxious versus questionable pain.  Nursing states he has been eating well, is incontinent of bowel and bladder and requires assistance for all ADLs except feeding.  Most recent labs done on 03/16/2022 with elevated glucose 162, BUN 27, creatinine 1.83 with low calcium 8.2 and EGFR 35.  On 03/13/2022 CBC was done with elevated WBC of 12.7, low RBC 2.98, hemoglobin 9.3, hematocrit 28.7%.  UA on 03/11/2022 with moderate blood and small leukocytes and few bacteria.  Urine culture at that time was positive for multiple species and recollection was suggested.  Had recent COVID infection on 12/23/2021.  On 03/11/2022 he also had CT of head, cervical spine and right hip without contrast with results as follows: CT head with moderate cortical volume loss and changes suggestive of chronic microvascular ischemic change.  CT cervical spine with grade 1 anterolisthesis of C7 on T1.  Severe intervertebral disc height loss from C3 through  C7. Associated endplate sclerosis and small endplate osteophytes.  Bilateral facet arthropathy and neural foraminal narrowing.  Right hip arthroplasty changes with cerclage wires in place. A slightly comminuted fracture of the greater trochanter is noted with stable alignment as compared with prior exams.  Chest x-ray 03/05/2022 with stable  cardiomegaly and no acute cardiopulmonary process.  History obtained from review of EMR, discussion with facility staff and/or Mr. Livas.  I reviewed available labs, medications, imaging, studies and related documents from the EMR.  Records reviewed and summarized above.   ROS Unable to obtain from pt as his dementia is too advanced  Physical Exam: Current and past weights: 164 lbs 10.9 ounces on 03/11/22 Constitutional: NAD General: frail appearing, thin  EYES: anicteric sclera, lids intact, no discharge  ENMT: intact hearing, oral mucous membranes moist CV: S1S2, RRR, no LE edema Pulmonary: CTAB, no increased work of breathing, no cough, room air Abdomen: hypo-active BS + 4 quadrants, soft and non tender, no ascites GU: deferred MSK: noted sarcopenia of all 4 extremities with loss of subcu fat centrally over prominent vertebrae/ribs, moves all extremities, ambulatory Skin: warm and dry, no rashes or wounds on visible skin. Dry skin noted on distal BLE Neuro:  noted generalized weakness, mumbling mostly non-sensically Psych: moderate anxious affect, Confused                                                                                                                      Hem/lymph/immuno: no widespread bruising  CURRENT PROBLEM LIST:  Patient Active Problem List   Diagnosis Date Noted   UTI (urinary tract infection) 03/13/2022   Falls frequently 13/24/4010   Complicated UTI (urinary tract infection) 03/11/2022   Hip fracture requiring operative repair, left, closed, with routine healing, subsequent encounter 03/11/2022   Hip fracture ([facility]) 03/05/2022   Closed right hip fracture ([place] [facility]) 03/05/2022   Anxiety 04/06/2018   Idiopathic peripheral neuropathy 04/06/2018   Anemia, chronic renal failure, stage 3 (moderate) ([facility]) 11/23/2015   Benign prostatic hyperplasia without lower urinary tract symptoms 07/09/2014   PAST MEDICAL HISTORY:  Active Ambulatory Problems    Diagnosis  Date Noted   Hip fracture ([facility]) 03/05/2022   Closed right hip fracture ([facility]) 03/05/2022   Anemia, chronic renal failure, stage 3 (moderate) ([facility]) 11/23/2015   Anxiety 04/06/2018   Benign prostatic hyperplasia without lower urinary tract symptoms 07/09/2014   Idiopathic peripheral neuropathy 04/06/2018   Falls frequently 27/25/3664   Complicated UTI (urinary tract infection) 03/11/2022   Hip fracture requiring operative repair, left, closed, with routine healing, subsequent encounter 03/11/2022   UTI (urinary tract infection) 03/13/2022   Resolved Ambulatory Problems    Diagnosis Date Noted   No Resolved Ambulatory Problems   Past Medical History:  Diagnosis Date   Chronic kidney disease    Dementia ([facility])    SOCIAL HX:  Social History   Tobacco Use   Smoking status: Never   Smokeless tobacco: Never  Substance Use Topics   Alcohol use: Never  FAMILY HX: No family history on file.     Preferred Pharmacy: ALLERGIES:  Allergies  Allergen Reactions   Demerol [Meperidine Hcl]      PERTINENT MEDICATIONS:  Outpatient Encounter Medications as of 04/02/2022  Medication Sig   busPIRone (BUSPAR) 5 MG tablet Take 5 mg by mouth at bedtime.   divalproex (DEPAKOTE) 125 MG DR tablet Take 125 mg by mouth 2 (two) times daily.   enoxaparin (LOVENOX) 30 MG/0.3ML injection Inject 0.3 mLs (30 mg total) into the skin daily for 20 days.   ferrous sulfate 325 (65 FE) MG tablet Take 1 tablet (325 mg total) by mouth daily. (Patient not taking: Reported on 03/11/2022)   haloperidol (HALDOL) 0.5 MG tablet Take 1 tablet (0.5 mg total) by mouth every 8 (eight) hours as needed for agitation.   mirtazapine (REMERON) 7.5 MG tablet Take 1 tablet (7.5 mg total) by mouth at bedtime.   polyethylene glycol (MIRALAX / GLYCOLAX) 17 g packet Take 17 g by mouth daily.   Psyllium (METAMUCIL PO) Take 0.4 g by mouth in the morning and at bedtime.   sennosides-docusate sodium (SENOKOT-S) 8.6-50 MG tablet Take 1  tablet by mouth See admin instructions. At bedtime on Monday,Wednesday and friday   sertraline (ZOLOFT) 25 MG tablet Take 25 mg by mouth daily.   sodium bicarbonate 650 MG tablet Take 1 tablet (650 mg total) by mouth 2 (two) times daily.   traZODone (DESYREL) 50 MG tablet Take 50 mg by mouth at bedtime.   Vitamin D3 (VITAMIN D) 25 MCG tablet Take 1,000 Units by mouth daily.   No facility-administered encounter medications on file as of 04/02/2022.     Advance Care Planning/Goals of Care: Patient unable to give his permission to discuss CODE STATUS: Last code status on record with the hospital was DNR/DNI.    Thank you for the opportunity to participate in the care of Mr. Bohlken.  The palliative care team will continue to follow. Please call our office at [phone] if we can be of additional assistance.   Marijo Conception, FNP-C  COVID-19 PATIENT SCREENING TOOL Asked and negative response unless otherwise noted:  Have you had symptoms of covid, tested positive or been in contact with someone with symptoms/positive test in the past 5-10 days?

## 2022-04-06 ENCOUNTER — Encounter: Payer: Self-pay | Admitting: Family Medicine

## 2022-04-06 DIAGNOSIS — Z515 Encounter for palliative care: Secondary | ICD-10-CM | POA: Insufficient documentation

## 2022-04-15 ENCOUNTER — Non-Acute Institutional Stay: Payer: Medicare Other | Admitting: Family Medicine

## 2022-04-15 ENCOUNTER — Encounter: Payer: Self-pay | Admitting: Family Medicine

## 2022-04-15 VITALS — BP 90/54 | HR 69 | Resp 18

## 2022-04-15 DIAGNOSIS — F03C18 Unspecified dementia, severe, with other behavioral disturbance: Secondary | ICD-10-CM

## 2022-04-15 DIAGNOSIS — Z599 Problem related to housing and economic circumstances, unspecified: Secondary | ICD-10-CM

## 2022-04-15 DIAGNOSIS — E43 Unspecified severe protein-calorie malnutrition: Secondary | ICD-10-CM | POA: Insufficient documentation

## 2022-04-15 DIAGNOSIS — G4720 Circadian rhythm sleep disorder, unspecified type: Secondary | ICD-10-CM

## 2022-04-15 DIAGNOSIS — S72001S Fracture of unspecified part of neck of right femur, sequela: Secondary | ICD-10-CM

## 2022-04-15 DIAGNOSIS — Z754 Unavailability and inaccessibility of other helping agencies: Secondary | ICD-10-CM | POA: Insufficient documentation

## 2022-04-15 DIAGNOSIS — F03C0 Unspecified dementia, severe, without behavioral disturbance, psychotic disturbance, mood disturbance, and anxiety: Secondary | ICD-10-CM | POA: Insufficient documentation

## 2022-04-15 DIAGNOSIS — Z7401 Bed confinement status: Secondary | ICD-10-CM

## 2022-04-15 NOTE — Progress Notes (Signed)
Jesse Cherry Consult Note Telephone: [phone]  Fax: [phone]    Date of encounter: 04/15/22 9:55 AM PATIENT NAME: Jesse Cherry [address] [place] [address]   [phone] (home)  DOB: [birth year] MRN: [id number] PRIMARY CARE PROVIDER:    Jonathon Bellows, PA-C,  [address] [place] [address] [phone]  REFERRING PROVIDER:   Jonathon Cherry, Jesse Cherry [address],  [place] [address] [phone]  RESPONSIBLE PARTY:    Contact Information     Name Relation Home Work Jesse Cherry Daughter [phone]     Jesse Cherry Daughter   [phone]        I met face to face with patient in the skilled nursing facility. Spoke with daughter, Jesse Cherry by phone. Palliative Care was asked to follow this patient by consultation request of  Podraza, Karmen Bongo* to address advance care planning and complex medical decision making. This is a follow up visit.                                   ASSESSMENT, SYMPTOM MANAGEMENT AND PLAN / RECOMMENDATIONS:   Severe unspecified Dementia with other behavioral disturbance/Sleep Pattern Disturbance Fast 7 score 7c Increase Mirtazapine from 7.5 to 15 mg QHS. Continue Depakote 125 mg po BID.  On 04/18/22, increase HS dose of Depakote to 250 mg with daily admin of 125 mg.  If tolerating ok with improvement, will increase dose next week to 250 mg BID Depakote DR. Continue Buspar 5 mg, Trazodone 50 mg QHS, Sertraline 25 mg daily  2.  Closed fracture of hip sequela/bedbound Unable to ambulate currently. Given severe dementia pt is unable to follow instructions to successfully rehabilitate and has become bed or chair bound.  3.  Inadequate Community Resources Daughter wants to return to [facility] if they will accept pt. SW Referral to help complete FL2, discuss possible resources needed for care if pt returns to lower level of  care.  4.   Severe protein calorie malnutrition Suspect pt has had significant weight loss, weight in 2/23 was 149.6 lbs. Albumin as of 03/12/22 was down to 2.4 from 3.6 on 12/23/21.       Advance Care Planning/Goals of Care: Goals include to maximize quality of life and symptom management. Health care surrogate gave her permission to discuss. Our advance care planning conversation included a discussion about:     Exploration of personal, cultural or spiritual beliefs that might influence medical decisions-she states when her dad was in his "right" mind that he did not want resuscitation or a feeding tube. Exploration of goals of care in the event of a sudden injury or illness Identification of a healthcare agent-Jesse Cherry Review of an  advance directive document-DNR and MOST Decision not to resuscitate or to de-escalate disease focused treatments due to poor prognosis. CODE STATUS: MOST as of 86/2/23: DNR/DNI with limited additional intervention Use of antibiotics and IV fluids if indicated Feeding tube was not addressed-daughter states no feeding tube, will address update to MOST when available.     Follow up Palliative Care Visit: Palliative care will continue to follow for complex medical decision making, advance care planning, and clarification of goals. Return 2-4 weeks or prn.    This visit was coded based on medical decision making (MDM).  PPS: 30%  HOSPICE ELIGIBILITY/DIAGNOSIS: TBD  Chief Complaint:  Palliative Care is following for chronic medical management of dementia with recent hip fracture.  Palliative Care is also following to assist with advance care planning and defining/refining goals of care.  HISTORY OF PRESENT ILLNESS:  Jesse Cherry is a 88 y.o. year old male with dementia who was admitted for rehab following surgical repair of hip fracture. He urinates on the floor. He tries to get out of the bed on his own and has been awake at night, sleeping days  and agitated especially since his Haldol was d/c'd. Lays in the bed repeating "Oh, oh, oh". Nursing states he is unable to cooperate with physical therapy. He is eating at times but sometimes he is not. He is on Remeron 7.5 mg QHS, Depakote 125 mg BID, Trazodone 50 mg QHS, Sertraline 25 mg daily and Buspar 5 mg QHS. Spoke with daughter Jesse Cherry who indicates that her dad doesn't hear well so he cannot understand the instructions being given and this is why when someone attempts to help him stand to do therapy he may be resistive.  She states that prior to his hip fracture he was at [facility] and they had titrated his meds and got him to start eating again and interacting with staff/residents. She would like him to be able to return to [facility] if possible and wants to know how to make this happen. She states that since he fell, had surgery and has been in rehab he has been deteriorating and she does not think he will walk again.  She said he does not respond to a question of whether or not he has pain now where he did before.  She would like him to be on the same medications and her goal is to provide relief from discomfort and agitation to allow him to function at his maximum capability although she concedes significant decline in the last 10 months particularly since the surgery.  She emailed his prior med list from [facility] and all his meds for sleep and behavior are the same with exception of doses of two meds:  Mirtazapine was 15 mg QHS and Depakote was 250 mg BID, current doses are 7.5 mg  QHS and 125 mg BID respectively.  She states he does not interact with her at all currently and resists taking a bath/personal care.  We did discuss the possibility that he is currently not sleeping at night and wanting to sleep during the day making it difficult to get adequate intake of food and fluids in him.  Advised that if he has completed his skilled days that he may be a candidate for Hospice Care.  His  current FAST 7 score is 7c which would make him appropriate for Hospice referral if off skilled days.  Daughter is agreeable to contact from Education officer, museum to discuss completion of FL2 to forward to [facility] but informed her that he would likely require more care since he is non-ambulatory and cannot go to get his meals, that most likely he will require a caregiver and this may be private pay.  History obtained from review of EMR, discussion with daughter Jesse Cherry, facility staff and/or Jesse Cherry.  I reviewed available labs, medications, imaging, studies and related documents from the EMR.  Records reviewed and summarized above.   ROS Unable to provide any history due to advanced dementia  Physical Exam: Current and past weights: 164 lbs 10.9 ounces as of 03/11/22, weight today but was unavailable at time of assessment Constitutional: NAD  General: frail appearing, thin EYES: anicteric sclera, lids intact, no discharge  ENMT: intact hearing, oral mucous membranes moist, dentition intact CV: S1S2, RRR, no LE edema Pulmonary: CTAB, no increased work of breathing, no cough, room air Abdomen: normo-active BS + 4 quadrants, soft and non tender, no ascites GU: deferred MSK: noted subcu fat and muscle loss of all 4 extremities, moves all extremities, bed or chair bound Skin: warm and dry, no rashes or wounds on visible skin Neuro:  noted generalized weakness,  significant cognitive impairment Psych: non-anxious affect, sleeping, arousable but no meaningful response to questions Hem/lymph/immuno: no widespread bruising   Thank you for the opportunity to participate in the care of Jesse Cherry.  The palliative care team will continue to follow. Please call our office at [phone] if we can be of additional assistance.   Marijo Conception, FNP -C  COVID-19 PATIENT SCREENING TOOL Asked and negative response unless otherwise noted:   Have you had symptoms of covid, tested positive or been in  contact with someone with symptoms/positive test in the past 5-10 days?  Unknown

## 2022-04-17 ENCOUNTER — Other Ambulatory Visit: Payer: Medicare Other

## 2022-04-17 DIAGNOSIS — Z515 Encounter for palliative care: Secondary | ICD-10-CM

## 2022-04-17 NOTE — Progress Notes (Signed)
COMMUNITY PALLIATIVE CARE SW NOTE  PATIENT NAME: Jesse Cherry DOB: 25-Aug-1933 MRN: [id number]  PRIMARY CARE PROVIDER: Bailey Mech, PA-C  RESPONSIBLE PARTY:  Acct ID - Guarantor Home Phone Work Phone Relationship Acct Type  [id number] - Mitchelle,DO* [phone]  Self P/F     [address], [place], [place] [address]     SOCIAL WORK Telephonic Encounter  PC SW completed a telephonic encounter with patient's daughter-[name]. Arline Asp advised that patient is currently in [facility] for rehab, but she is hoping that patient can return to [facility]. Patient status has changed where he now total care. SW provided the different levels of care. SW also provided education regarding the process for placement to included being assessed by the facility and completion of the FL2. Arline Asp advised that she will follow-up with [facility] if and when an assessment will take place. SW extended ongoing support to her and encouraged Arline Asp to call with any additional questions or concerns. SW provided her contact information.     [address] [place], [place]

## 2022-06-17 DEATH — deceased

## 2023-06-05 IMAGING — DX DG CHEST 1V PORT
1 series · 1 of 1 positions shown · non-contrast
Comparison: 10/31/2018

CLINICAL DATA: Weakness.

EXAM:
PORTABLE CHEST 1 VIEW

[chest ap]
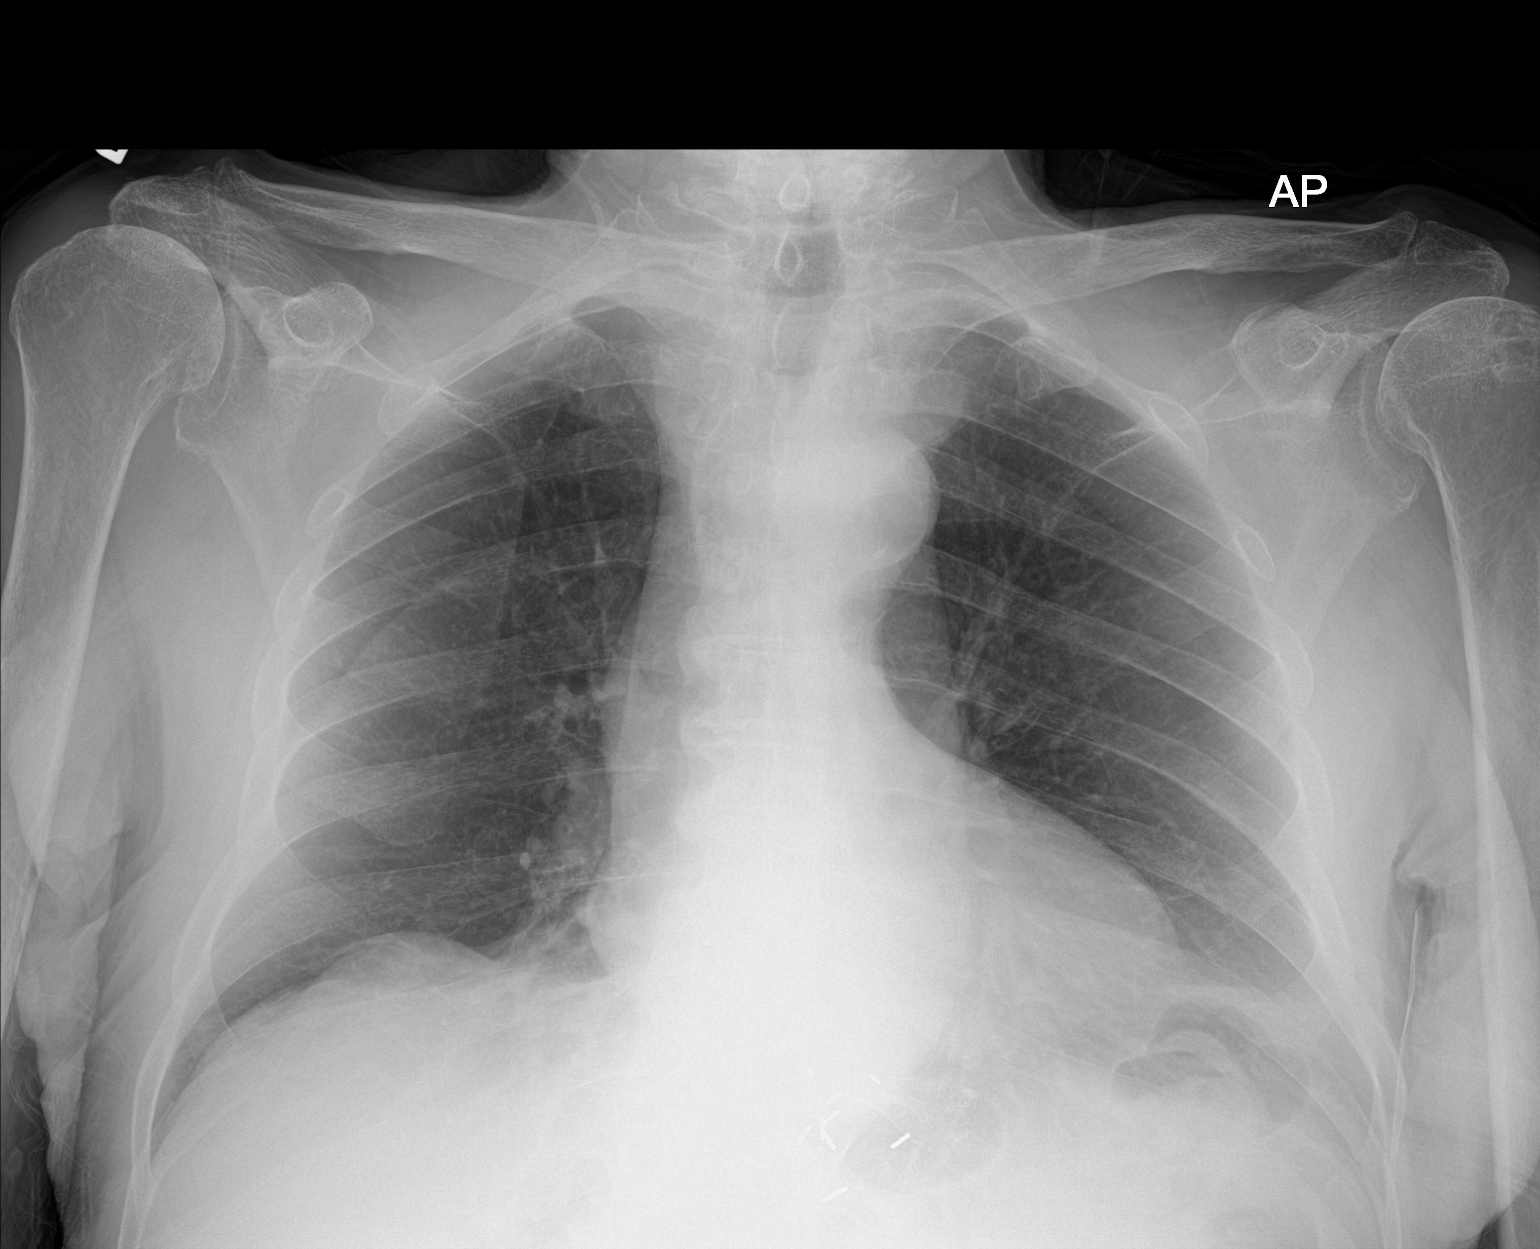

[1 of 1 positions shown; findings below may reference images not displayed]

FINDINGS: Stable heart size.The cardiomediastinal contours are unchanged with
aortic tortuosity. Streaky right basilar atelectasis or scarring.
Pulmonary vasculature is normal. No consolidation, pleural effusion,
or pneumothorax. No acute osseous abnormalities are seen.
IMPRESSION: Streaky right basilar atelectasis or scarring.

## 2023-06-05 IMAGING — CT CT HEAD W/O CM
3 of 4 series · 16 of 47 positions shown, 19 images · non-contrast
Comparison: Brain MRI 01/09/2016

CLINICAL DATA: Dementia patient with altered mental status.

EXAM:
CT HEAD WITHOUT CONTRAST
TECHNIQUE: Contiguous axial images were obtained from the base of the skull
through the vertex without intravenous contrast.

[Series 3: head 2.0 h70h · axial · 0.44mm/px · z∈[-468,-314]mm · 10 of 87 slices shown, 13 images]
[im 5/87  brain]
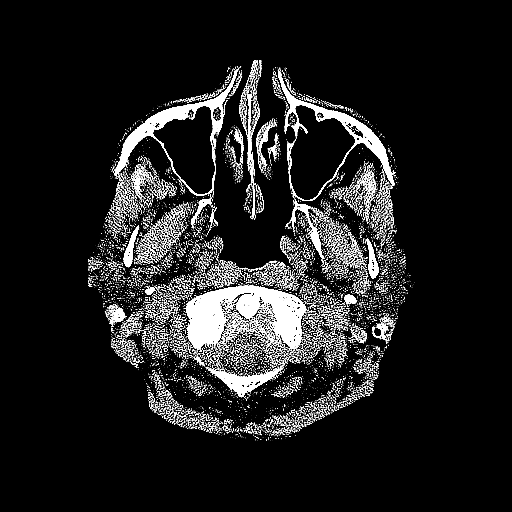
[im 5/87  bone]
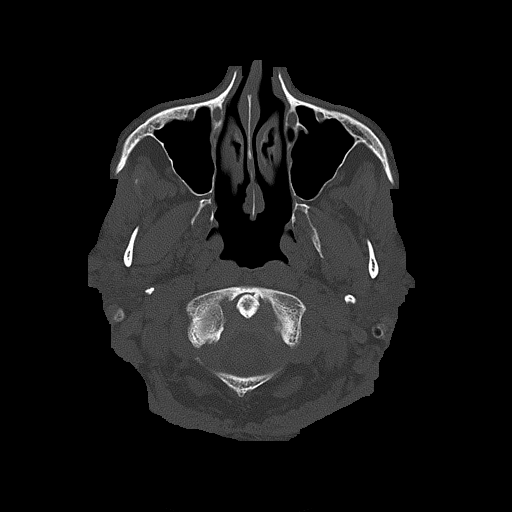
[im 13/87  brain]
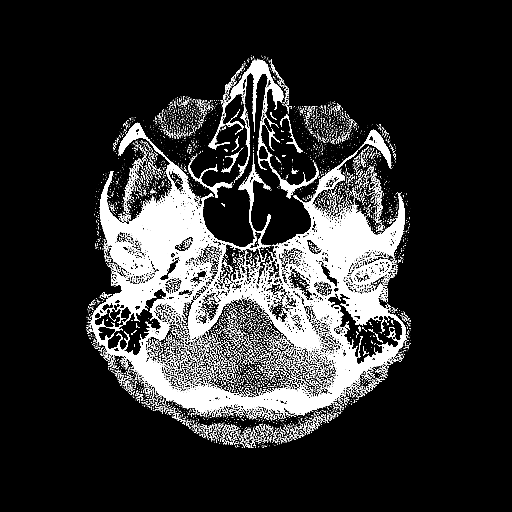
[im 22/87  brain]
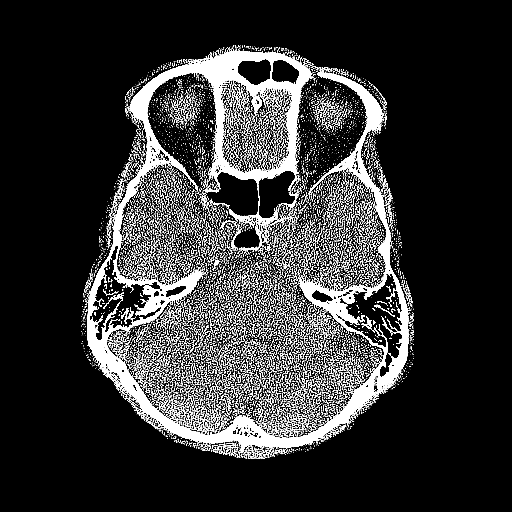
[im 31/87  brain]
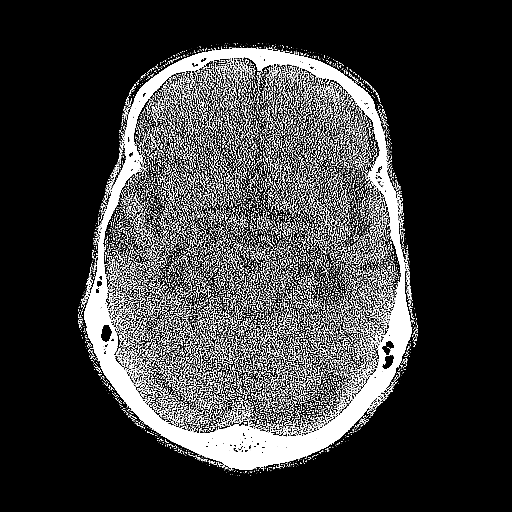
[im 39/87  brain]
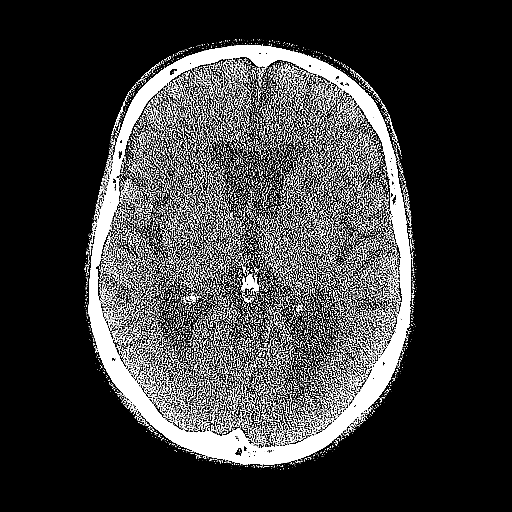
[im 39/87  bone]
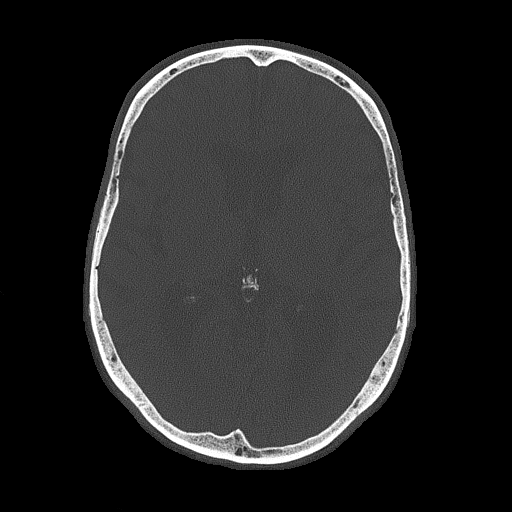
[im 48/87  brain]
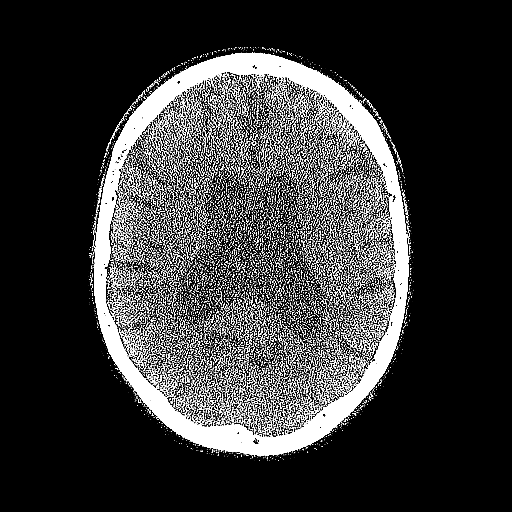
[im 56/87  brain]
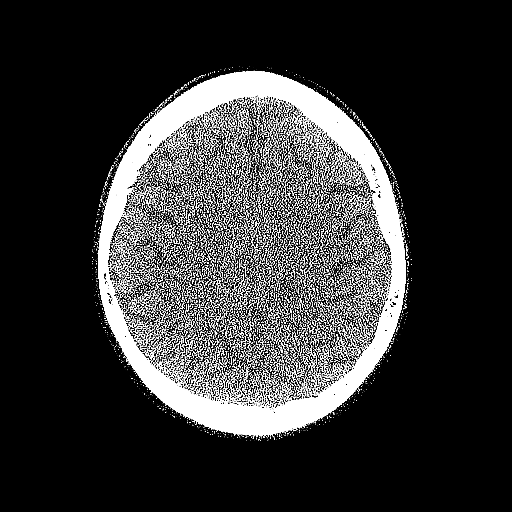
[im 65/87  brain]
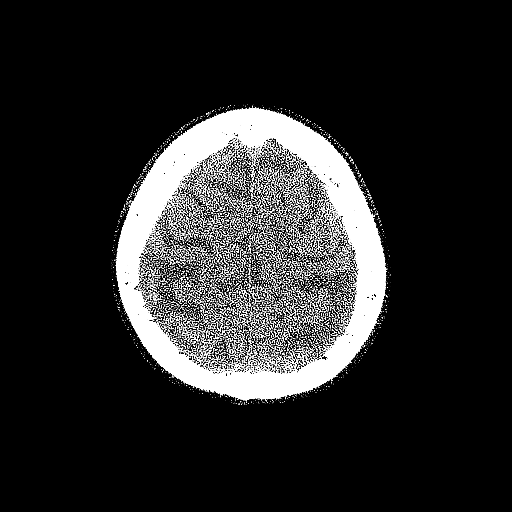
[im 74/87  brain]
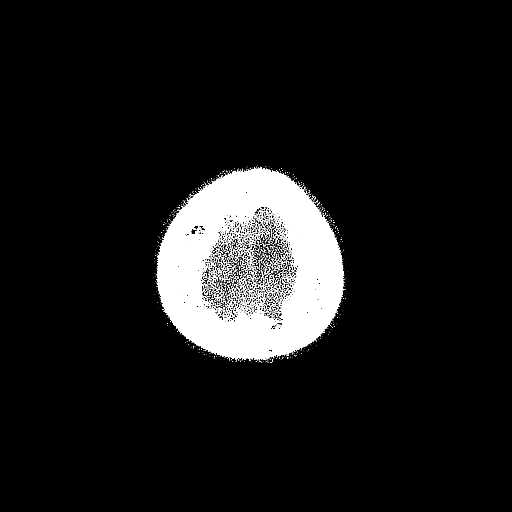
[im 74/87  bone]
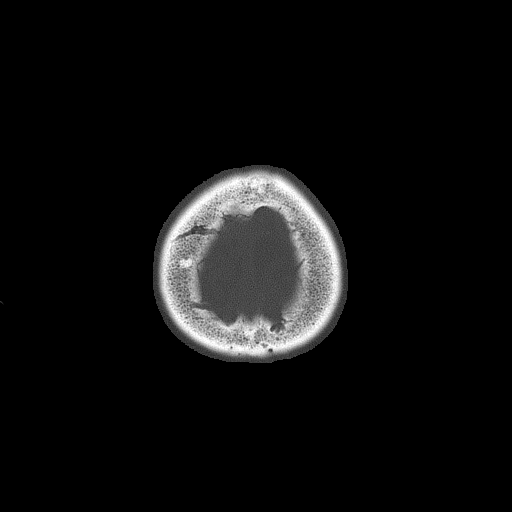
[im 82/87  brain]
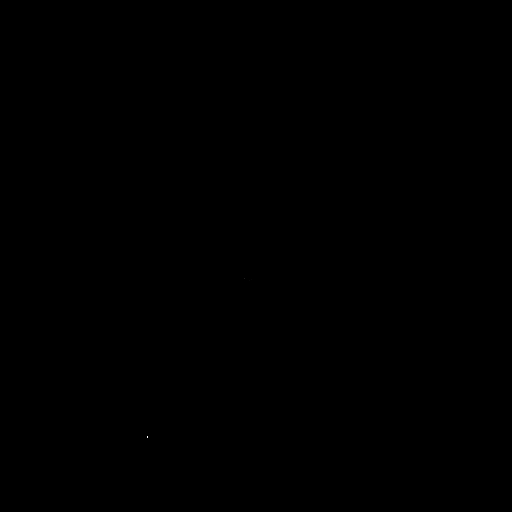

[Series 4: head 3.0 mpr cor · coronal · 0.34mm/px · 3 of 67 slices shown]
[im 23/67  brain]
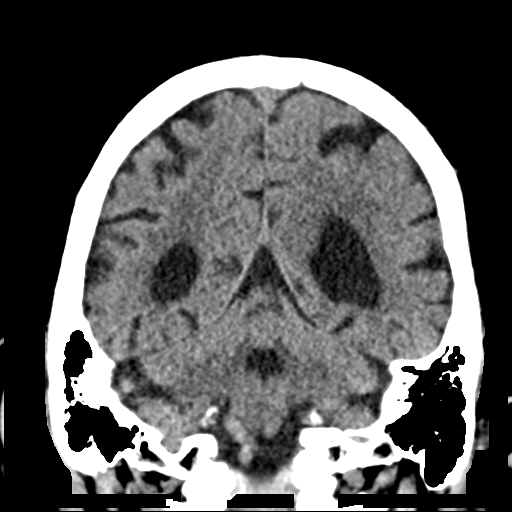
[im 30/67  brain]
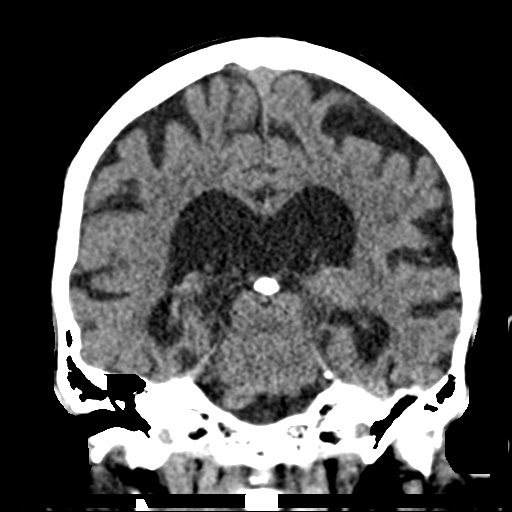
[im 37/67  brain]
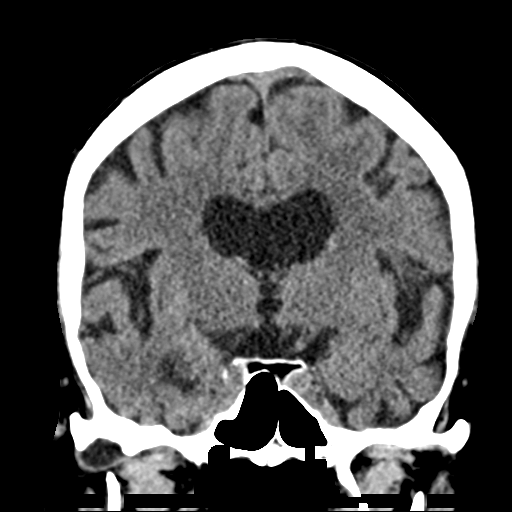

[Series 5: head 3.0 mpr sag · sagittal · 0.34mm/px · 3 of 67 slices shown]
[im 23/67  brain]
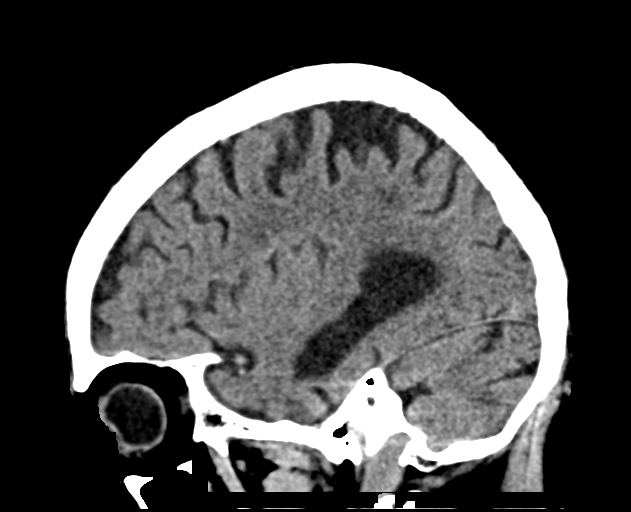
[im 34/67  brain]
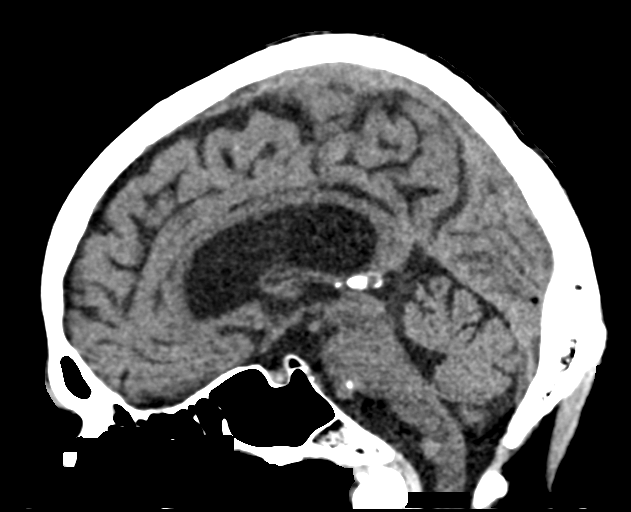
[im 45/67  brain]
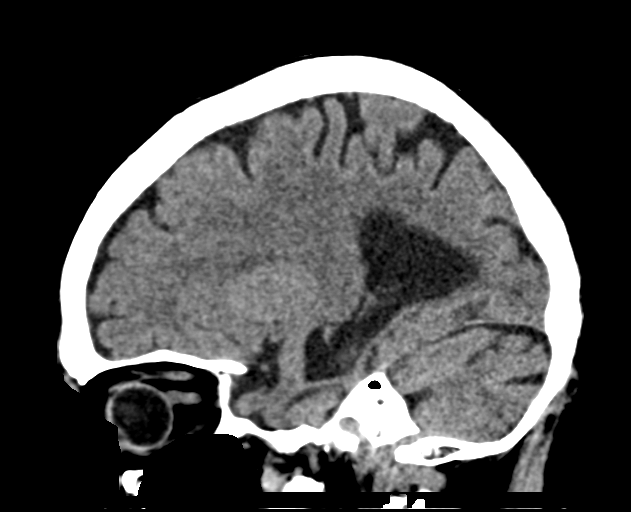

[16 of 47 positions shown; findings below may reference images not displayed]

FINDINGS: Brain: No evidence of acute infarction, hemorrhage, hydrocephalus,
extra-axial collection or mass lesion/mass effect. Generalized
atrophy and ventriculomegaly, with slight progression from 9391
exam. Remote right basal gangliar lacunar infarct.

Vascular: Atherosclerosis of skullbase vasculature without
hyperdense vessel or abnormal calcification.

Skull: No fracture or focal lesion.

Sinuses/Orbits: Paranasal sinuses and mastoid air cells are clear.
The visualized orbits are unremarkable. Bilateral cataract
resection.

Other: None.
IMPRESSION: 1. No acute intracranial abnormality.
2. Generalized atrophy and ventriculomegaly, with slight progression
from 9391 exam.

## 2024-01-13 NOTE — Telephone Encounter (Signed)
Submitted in error
